# Patient Record
Sex: Female | Born: 1945
Health system: Southern US, Community
[De-identification: ages and names within clinical notes are randomized; demographics above are authoritative.]

## PROBLEM LIST (undated history)

## (undated) DIAGNOSIS — D219 Benign neoplasm of connective and other soft tissue, unspecified: Secondary | ICD-10-CM

## (undated) DIAGNOSIS — K589 Irritable bowel syndrome without diarrhea: Secondary | ICD-10-CM

## (undated) DIAGNOSIS — T7840XA Allergy, unspecified, initial encounter: Secondary | ICD-10-CM

## (undated) DIAGNOSIS — D369 Benign neoplasm, unspecified site: Secondary | ICD-10-CM

## (undated) DIAGNOSIS — K219 Gastro-esophageal reflux disease without esophagitis: Secondary | ICD-10-CM

## (undated) DIAGNOSIS — C801 Malignant (primary) neoplasm, unspecified: Secondary | ICD-10-CM

## (undated) DIAGNOSIS — I10 Essential (primary) hypertension: Secondary | ICD-10-CM

## (undated) DIAGNOSIS — L719 Rosacea, unspecified: Secondary | ICD-10-CM

## (undated) HISTORY — DX: Rosacea, unspecified: L71.9

## (undated) HISTORY — DX: Essential (primary) hypertension: I10

## (undated) HISTORY — PX: BREAST SURGERY: SHX581

## (undated) HISTORY — DX: Benign neoplasm of connective and other soft tissue, unspecified: D21.9

## (undated) HISTORY — DX: Benign neoplasm, unspecified site: D36.9

## (undated) HISTORY — DX: Gastro-esophageal reflux disease without esophagitis: K21.9

## (undated) HISTORY — DX: Allergy, unspecified, initial encounter: T78.40XA

## (undated) HISTORY — DX: Irritable bowel syndrome, unspecified: K58.9

## (undated) HISTORY — PX: OOPHORECTOMY: SHX86

---
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
## 1968-07-08 HISTORY — PX: BREAST EXCISIONAL BIOPSY: SUR124

## 1991-07-09 DIAGNOSIS — D219 Benign neoplasm of connective and other soft tissue, unspecified: Secondary | ICD-10-CM

## 1991-07-09 HISTORY — PX: ABDOMINAL HYSTERECTOMY: SHX81

## 1991-07-09 HISTORY — DX: Benign neoplasm of connective and other soft tissue, unspecified: D21.9

## 2002-07-20 ENCOUNTER — Other Ambulatory Visit: Admission: RE | Admit: 2002-07-20 | Discharge: 2002-07-20 | Payer: Self-pay | Admitting: Family Medicine

## 2004-12-24 ENCOUNTER — Ambulatory Visit: Payer: Self-pay | Admitting: Family Medicine

## 2005-02-18 ENCOUNTER — Ambulatory Visit: Payer: Self-pay | Admitting: Family Medicine

## 2005-05-15 ENCOUNTER — Other Ambulatory Visit: Admission: RE | Admit: 2005-05-15 | Discharge: 2005-05-15 | Payer: Self-pay | Admitting: Family Medicine

## 2005-05-15 ENCOUNTER — Ambulatory Visit: Payer: Self-pay | Admitting: Family Medicine

## 2005-05-15 ENCOUNTER — Encounter: Payer: Self-pay | Admitting: Family Medicine

## 2005-05-16 ENCOUNTER — Encounter: Payer: Self-pay | Admitting: Family Medicine

## 2005-06-18 ENCOUNTER — Ambulatory Visit: Payer: Self-pay | Admitting: Family Medicine

## 2006-03-31 ENCOUNTER — Ambulatory Visit: Payer: Self-pay | Admitting: Family Medicine

## 2006-04-09 ENCOUNTER — Ambulatory Visit: Payer: Self-pay | Admitting: Family Medicine

## 2006-04-30 ENCOUNTER — Ambulatory Visit: Payer: Self-pay | Admitting: Family Medicine

## 2007-05-04 ENCOUNTER — Encounter: Payer: Self-pay | Admitting: Family Medicine

## 2007-05-04 DIAGNOSIS — L719 Rosacea, unspecified: Secondary | ICD-10-CM | POA: Insufficient documentation

## 2007-05-04 DIAGNOSIS — F329 Major depressive disorder, single episode, unspecified: Secondary | ICD-10-CM

## 2007-05-04 DIAGNOSIS — K589 Irritable bowel syndrome without diarrhea: Secondary | ICD-10-CM | POA: Insufficient documentation

## 2007-05-04 DIAGNOSIS — I10 Essential (primary) hypertension: Secondary | ICD-10-CM | POA: Insufficient documentation

## 2007-05-04 DIAGNOSIS — F3289 Other specified depressive episodes: Secondary | ICD-10-CM | POA: Insufficient documentation

## 2007-05-05 ENCOUNTER — Ambulatory Visit: Payer: Self-pay | Admitting: Family Medicine

## 2007-05-06 LAB — CONVERTED CEMR LAB
Basophils Absolute: 0 10*3/uL (ref 0.0–0.1)
CO2: 31 meq/L (ref 19–32)
Calcium: 10 mg/dL (ref 8.4–10.5)
Chloride: 102 meq/L (ref 96–112)
Eosinophils Relative: 3.3 % (ref 0.0–5.0)
GFR calc Af Amer: 94 mL/min
Glucose, Bld: 95 mg/dL (ref 70–99)
HCT: 41.4 % (ref 36.0–46.0)
LDL Cholesterol: 107 mg/dL — ABNORMAL HIGH (ref 0–99)
Neutrophils Relative %: 57.3 % (ref 43.0–77.0)
RBC: 4.48 M/uL (ref 3.87–5.11)
RDW: 12.1 % (ref 11.5–14.6)
Total CHOL/HDL Ratio: 2.7
WBC: 6.1 10*3/uL (ref 4.5–10.5)

## 2007-09-01 ENCOUNTER — Ambulatory Visit: Payer: Self-pay | Admitting: Family Medicine

## 2007-09-01 ENCOUNTER — Encounter: Payer: Self-pay | Admitting: Family Medicine

## 2007-09-03 ENCOUNTER — Encounter (INDEPENDENT_AMBULATORY_CARE_PROVIDER_SITE_OTHER): Payer: Self-pay | Admitting: *Deleted

## 2008-10-31 ENCOUNTER — Ambulatory Visit: Payer: Self-pay | Admitting: Family Medicine

## 2008-11-02 ENCOUNTER — Encounter: Payer: Self-pay | Admitting: Family Medicine

## 2008-11-02 ENCOUNTER — Ambulatory Visit: Payer: Self-pay | Admitting: Family Medicine

## 2008-11-02 LAB — CONVERTED CEMR LAB
Albumin: 3.9 g/dL (ref 3.5–5.2)
BUN: 18 mg/dL (ref 6–23)
Basophils Absolute: 0 10*3/uL (ref 0.0–0.1)
Bilirubin, Direct: 0.1 mg/dL (ref 0.0–0.3)
CO2: 30 meq/L (ref 19–32)
Cholesterol: 190 mg/dL (ref 0–200)
Creatinine, Ser: 0.7 mg/dL (ref 0.4–1.2)
Eosinophils Absolute: 0.3 10*3/uL (ref 0.0–0.7)
Eosinophils Relative: 4.5 % (ref 0.0–5.0)
HDL: 65.8 mg/dL (ref >39.00)
Lymphs Abs: 2.1 10*3/uL (ref 0.7–4.0)
MCHC: 34.8 g/dL (ref 30.0–36.0)
MCV: 92.6 fL (ref 78.0–100.0)
Monocytes Absolute: 0.5 10*3/uL (ref 0.1–1.0)
Neutrophils Relative %: 54.5 % (ref 43.0–77.0)
Platelets: 196 10*3/uL (ref 150.0–400.0)
RDW: 11.7 % (ref 11.5–14.6)
Total Bilirubin: 1.1 mg/dL (ref 0.3–1.2)
Total CHOL/HDL Ratio: 3
Triglycerides: 176 mg/dL — ABNORMAL HIGH (ref 0.0–149.0)
WBC: 6.5 10*3/uL (ref 4.5–10.5)

## 2008-11-07 ENCOUNTER — Encounter (INDEPENDENT_AMBULATORY_CARE_PROVIDER_SITE_OTHER): Payer: Self-pay | Admitting: *Deleted

## 2009-10-30 ENCOUNTER — Telehealth: Payer: Self-pay | Admitting: Family Medicine

## 2009-11-13 ENCOUNTER — Ambulatory Visit: Payer: Self-pay | Admitting: Family Medicine

## 2009-11-13 DIAGNOSIS — J301 Allergic rhinitis due to pollen: Secondary | ICD-10-CM | POA: Insufficient documentation

## 2009-11-14 LAB — CONVERTED CEMR LAB
Albumin: 4.2 g/dL (ref 3.5–5.2)
BUN: 16 mg/dL (ref 6–23)
Basophils Absolute: 0 10*3/uL (ref 0.0–0.1)
Cholesterol: 197 mg/dL (ref 0–200)
Creatinine, Ser: 0.7 mg/dL (ref 0.4–1.2)
Eosinophils Absolute: 0.2 10*3/uL (ref 0.0–0.7)
GFR calc non Af Amer: 86.69 mL/min (ref >60)
HCT: 37.9 % (ref 36.0–46.0)
HDL: 84.4 mg/dL (ref >39.00)
Hemoglobin: 13 g/dL (ref 12.0–15.0)
LDL Cholesterol: 102 mg/dL — ABNORMAL HIGH (ref 0–99)
Lymphocytes Relative: 31.9 % (ref 12.0–46.0)
Lymphs Abs: 1.6 10*3/uL (ref 0.7–4.0)
MCHC: 34.3 g/dL (ref 30.0–36.0)
Monocytes Relative: 10.2 % (ref 3.0–12.0)
Neutro Abs: 2.7 10*3/uL (ref 1.4–7.7)
Phosphorus: 4 mg/dL (ref 2.3–4.6)
Platelets: 208 10*3/uL (ref 150.0–400.0)
RDW: 13.2 % (ref 11.5–14.6)
TSH: 1.6 microintl units/mL (ref 0.35–5.50)
Total CHOL/HDL Ratio: 2
Triglycerides: 53 mg/dL (ref 0.0–149.0)

## 2009-11-15 ENCOUNTER — Encounter: Payer: Self-pay | Admitting: Family Medicine

## 2009-11-15 ENCOUNTER — Ambulatory Visit: Payer: Self-pay | Admitting: Family Medicine

## 2009-11-15 LAB — HM MAMMOGRAPHY: HM Mammogram: NORMAL

## 2009-11-17 ENCOUNTER — Encounter: Payer: Self-pay | Admitting: Family Medicine

## 2010-08-07 NOTE — Progress Notes (Signed)
Summary: Hyoscyamine 0.125mg  refill  Phone Note Refill Request Call back at [phone] Message from:  [facility] on October 30, 2009 9:37 AM  Refills Requested: Medication #1:  LEVSIN/SL 0.125 MG  SUBL 1 SL two times a day as needed abdominal cramping. [facility] request refill electronically for Hyoscyamine 0.125mg  No date sent for last refill. Pt last seen 10/31/2008.Please advise.    Method Requested: Telephone to Pharmacy Initial call taken by: Lewanda Rife LPN,  October 30, 2009 9:38 AM  Follow-up for Phone Call        px written on EMR for call in  Follow-up by: Judith Part MD,  October 30, 2009 12:23 PM  Additional Follow-up for Phone Call Additional follow up Details #1::        Medication [facility] as instructed. Lewanda Rife LPN  October 30, 2009 12:51 PM     New/Updated Medications: LEVSIN/SL 0.125 MG  SUBL (HYOSCYAMINE SULFATE) 1 SL two times a day as needed abdominal cramping Prescriptions: LEVSIN/SL 0.125 MG  SUBL (HYOSCYAMINE SULFATE) 1 SL two times a day as needed abdominal cramping  #15 x 5   Entered and Authorized by:   Judith Part MD   Signed by:   Lewanda Rife LPN on 78/29/5621   Method used:   Telephoned to ...       [facility] (retail)       [address]       [place], [place]  [address]       Ph: [phone] or [phone]       Fax: [phone]   RxID:   [id number]

## 2010-08-07 NOTE — Letter (Signed)
Summary: Results Follow up Letter  Marie Kennedy at [facility]  [address] [place], [place] [address]   Phone: [phone]  Fax: [phone]    11/17/2009 MRN: [id number]    [facility] [address] [place], [place]  [address]    Dear Marie Kennedy,  The following are the results of your recent test(s):  Test         Result    Pap Smear:        Normal _____  Not Normal _____ Comments: ______________________________________________________ Cholesterol: LDL(Bad cholesterol):         Your goal is less than:         HDL (Good cholesterol):       Your goal is more than: Comments:  ______________________________________________________ Mammogram:        Normal __X___  Not Normal _____ Comments:Please repeat mammogram in one year.  ___________________________________________________________________ Hemoccult:        Normal _____  Not normal _______ Comments:    _____________________________________________________________________ Other Tests:    We routinely do not discuss normal results over the telephone.  If you desire a copy of the results, or you have any questions about this information we can discuss them at your next office visit.   Sincerely,    Idamae Schuller Khaleb Broz,MD  MT/ri

## 2010-08-07 NOTE — Assessment & Plan Note (Signed)
Summary: REFILLL MEDICATIONS/CLE   Vital Signs:  Patient profile:   65 year old female Height:      64 inches Weight:      132.75 pounds BMI:     22.87 Temp:     98.3 degrees F oral Pulse rate:   76 / minute Pulse rhythm:   regular BP sitting:   126 / 74  (left arm) Cuff size:   regular  Vitals Entered By: Lewanda Rife LPN (Nov 14, 8411 9:18 AM) CC: refill med   History of Present Illness: here for f/u of HTN and IBS  is doing well  is looking foward to next vacation   wt is down 7 lb this visit  is eating less in general  is feeling ok in general   had a bad cold in march -- took a month to get over it with cough - with a sore chest   last LDL 89- good   bp continues to be well controlled with 126/74 today   mood -- is ok in general   may retire in another year   worse allergies this spring- both runny and stuffy nose and sneezing  Allergies (verified): No Known Drug Allergies  Past History:  Past Medical History: Last updated: 10/31/2008 Depression Hypertension IBS  rosacea  Past Surgical History: Last updated: May 18, 2007 Fibroids, cysts endometriosis (1993) Breast lumpectomy- no cancer (1970's) Hysterectomy Colonoscopy- neg (2002) dexa- normal per pt (03/2002)  Family History: Last updated: 18-May-2007 Father: CAD, HTN, DM 2, died from brain tumor Mother: HTN, died from breast cancer Siblings: 2 brothers  Social History: Last updated: 2007-05-18 Marital Status: Married Children: none Occupation: accountant  Risk Factors: Smoking Status: never (05/18/07)  Review of Systems General:  Denies fatigue, fever, loss of appetite, and malaise. Eyes:  Denies discharge and eye irritation. ENT:  Complains of nasal congestion and postnasal drainage; denies sinus pressure and sore throat. CV:  Denies chest pain or discomfort and palpitations. Resp:  Denies cough, shortness of breath, and wheezing. GI:  Denies diarrhea, nausea, and vomiting. MS:   Denies muscle aches. Derm:  Denies itching, poor wound healing, and rash. Neuro:  Denies headaches, numbness, and tingling. Psych:  Denies anxiety and depression. Endo:  Denies cold intolerance, excessive thirst, excessive urination, and heat intolerance. Heme:  Denies abnormal bruising and bleeding. Allergy:  Complains of seasonal allergies and sneezing.  Physical Exam  General:  Well-developed,well-nourished,in no acute distress; alert,appropriate and cooperative throughout examination Head:  normocephalic, atraumatic, and no abnormalities observed.  no sinus tenderness Eyes:  vision grossly intact, pupils equal, pupils round, and pupils reactive to no conjunctival pallor, injection or icterus  Nose:  nares boggy and pale Mouth:  pharynx pink and moist.   Neck:  No deformities, masses, or tenderness noted.supple, full ROM, no masses, no thyromegaly, no JVD, and no carotid bruits.   Lungs:  Normal respiratory effort, chest expands symmetrically. Lungs are clear to auscultation, no crackles or wheezes. Heart:  Normal rate and regular rhythm. S1 and S2 normal without gallop, murmur, click, rub or other extra sounds. Abdomen:  Bowel sounds positive,abdomen soft and non-tender without masses, organomegaly or hernias noted. no renal bruits Pulses:  R and L carotid,radial,femoral,dorsalis pedis and posterior tibial pulses are full and equal bilaterally Extremities:  No clubbing, cyanosis, edema, or deformity noted with normal full range of motion of all joints.   Neurologic:  sensation intact to light touch, gait normal, and DTRs symmetrical and normal.   Skin:  Intact without suspicious lesions or rashes Cervical Nodes:  No lymphadenopathy noted Inguinal Nodes:  No significant adenopathy Psych:  normal affect, talkative and pleasant    Impression & Recommendations:  Problem # 1:  HYPERTENSION (ICD-401.9) Assessment Unchanged  good control with current med enc more exercise and healthy  diet  lab today and adv refilled med  Her updated medication list for this problem includes:    Lisinopril-hydrochlorothiazide 10-12.5 Mg Tabs (Lisinopril-hydrochlorothiazide) ..... One half by mouth once daily  Orders: Venipuncture (66440) TLB-Lipid Panel (80061-LIPID) TLB-Renal Function Panel (80069-RENAL) TLB-CBC Platelet - w/Differential (85025-CBCD) TLB-TSH (Thyroid Stimulating Hormone) (84443-TSH) TLB-ALT (SGPT) (84460-ALT) TLB-AST (SGOT) (84450-SGOT) Prescription Created Electronically ([phone])  BP today: 126/74 Prior BP: 130/70 (10/31/2008)  Labs Reviewed: K+: 4.0 (10/31/2008) Creat: : 0.7 (10/31/2008)   Chol: 190 (10/31/2008)   HDL: 65.80 (10/31/2008)   LDL: 89 (10/31/2008)   TG: 176.0 (10/31/2008)  Problem # 2:  OTHER SCREENING MAMMOGRAM (ICD-V76.12) Assessment: Comment Only annual mam sched at pt request Orders: Radiology Referral (Radiology)  Problem # 3:  ROSACEA (ICD-695.3) Assessment: Improved doing well with metrocream from her dermatologist currently   Problem # 4:  Hx of IBS (ICD-564.1) Assessment: Unchanged  refilled levsin which she uses occas  overall stressed imp of high fiber diet -- and update if worse or not imp  trial of fiber suppl enc  Orders: Prescription Created Electronically [phone])  Problem # 5:  ALLERGIC RHINITIS, SEASONAL (ICD-477.0) Assessment: New recommend zyrtec or claritin otc and update if not imp could consid adding nasal steroid if not imp  Complete Medication List: 1)  Lisinopril-hydrochlorothiazide 10-12.5 Mg Tabs (Lisinopril-hydrochlorothiazide) .... One half by mouth once daily 2)  Metrocream 0.75 % Crea (Metronidazole) .... Apply to affected area once daily 3)  Levsin/sl 0.125 Mg Subl (Hyoscyamine sulfate) .Marland Kitchen.. 1 sl two times a day as needed abdominal cramping  Patient Instructions: 1)  for allergy symptoms - I recommend zyrtec or claritin 10 mg once daily   2)  we will schedule mammogram at check out  3)  lab  today  4)  follow up in one year unless problems  5)  keep working on healthy diet and exercise  6)  I sent medicines to pharmacy  Prescriptions: LEVSIN/SL 0.125 MG  SUBL (HYOSCYAMINE SULFATE) 1 SL two times a day as needed abdominal cramping  #15 x 11   Entered and Authorized by:   Judith Part MD   Signed by:   Judith Part MD on 11/13/2009   Method used:   Electronically to        [facility] (retail)       [address]       [place], [place]  [address]       Ph: [phone] or [phone]       Fax: [phone]   RxID:   [id number] LISINOPRIL-HYDROCHLOROTHIAZIDE 10-12.5 MG  TABS (LISINOPRIL-HYDROCHLOROTHIAZIDE) one half by mouth once daily  #30 x 11   Entered and Authorized by:   Judith Part MD   Signed by:   Judith Part MD on 11/13/2009   Method used:   Electronically to        [facility] (retail)       [address]       [place], [place]  [address]       Ph: [phone] or [phone]       Fax: [phone]   RxID:   [id number]   Current  Allergies (reviewed today): No known allergies

## 2010-08-07 NOTE — Miscellaneous (Signed)
Summary: mammogram screenig  Clinical Lists Changes  Observations: Added new observation of MAMMO DUE: 11/2010 (11/17/2009 16:33) Added new observation of MAMMOGRAM: normal (11/15/2009 16:33)      Preventive Care Screening  Mammogram:    Date:  11/15/2009    Next Due:  11/2010    Results:  normal

## 2010-12-04 ENCOUNTER — Telehealth: Payer: Self-pay | Admitting: *Deleted

## 2010-12-04 DIAGNOSIS — Z1231 Encounter for screening mammogram for malignant neoplasm of breast: Secondary | ICD-10-CM | POA: Insufficient documentation

## 2010-12-04 NOTE — Telephone Encounter (Signed)
Patient is asking for order for her mammogram to be faxed to [facility].

## 2010-12-04 NOTE — Telephone Encounter (Signed)
Will do mammogram order and route to [facility]

## 2010-12-07 NOTE — Telephone Encounter (Signed)
MMG order was faxed to [facility] per Aram Beecham.

## 2010-12-08 ENCOUNTER — Encounter: Payer: Self-pay | Admitting: Family Medicine

## 2010-12-12 ENCOUNTER — Encounter: Payer: Self-pay | Admitting: Family Medicine

## 2010-12-12 ENCOUNTER — Ambulatory Visit (INDEPENDENT_AMBULATORY_CARE_PROVIDER_SITE_OTHER): Payer: Managed Care, Other (non HMO) | Admitting: Family Medicine

## 2010-12-12 DIAGNOSIS — K589 Irritable bowel syndrome without diarrhea: Secondary | ICD-10-CM

## 2010-12-12 DIAGNOSIS — M8580 Other specified disorders of bone density and structure, unspecified site: Secondary | ICD-10-CM

## 2010-12-12 DIAGNOSIS — Z23 Encounter for immunization: Secondary | ICD-10-CM

## 2010-12-12 DIAGNOSIS — M949 Disorder of cartilage, unspecified: Secondary | ICD-10-CM

## 2010-12-12 DIAGNOSIS — M899 Disorder of bone, unspecified: Secondary | ICD-10-CM

## 2010-12-12 DIAGNOSIS — I10 Essential (primary) hypertension: Secondary | ICD-10-CM

## 2010-12-12 DIAGNOSIS — Z780 Asymptomatic menopausal state: Secondary | ICD-10-CM | POA: Insufficient documentation

## 2010-12-12 DIAGNOSIS — M810 Age-related osteoporosis without current pathological fracture: Secondary | ICD-10-CM | POA: Insufficient documentation

## 2010-12-12 MED ORDER — HYOSCYAMINE SULFATE 0.125 MG SL SUBL: 0.1250 mg | SUBLINGUAL_TABLET | Freq: Two times a day (BID) | SUBLINGUAL | Status: DC | PRN

## 2010-12-12 MED ORDER — LISINOPRIL-HYDROCHLOROTHIAZIDE 10-12.5 MG PO TABS: 0.5000 | ORAL_TABLET | Freq: Every day | ORAL | Status: DC

## 2010-12-12 NOTE — Assessment & Plan Note (Signed)
This is stable Refilled levsin Watches diet  Due for 10 y colonosc screen whenever she wants to schedule it - she voiced awareness

## 2010-12-12 NOTE — Assessment & Plan Note (Signed)
bp is well controlled on lisinopril hct without change Wt is stable Disc exercise and healthy diet  Lab today Med refilled

## 2010-12-12 NOTE — Patient Instructions (Addendum)
Try to get 1200-1500 mg of calcium per day with at least 1000 iu of vitamin D - for bone health  Also any form of weight bearing exercise like walking helps your bones  We will schedule bone density test at check out  Blood pressure looks good  Labs today  If you are interested in shingles vaccine in future - call your insurance company to see how coverage is and call us to schedule  Pneumovax today  I sent your px to the pharmacy

## 2010-12-12 NOTE — Progress Notes (Signed)
Subjective:    Patient ID: Marie Kennedy, female    DOB: [birth year], 65 y.o.   MRN: [id number]  HPI Here for f/u of chronic med problems incl HTN and rosacea and IBS  Feeling good and no new medical problems  Another year before retirement   HTN is well controlled  bp 116/68 today On lisinopril hct No side eff No ha or cp or palpitations  Not enough exercise- likes to walk    Uses metrocream for rosacea- does not need refil   Last colonosc 2002 Has ibs- uses levsin for cramping  No changes  Due to screen colonosc- does not want to plan that yet  No blood in stool or other problems  Cramping is no worse   Diet is good - very healthy - tries to be   Had abnormal bone density screen - this spring at work  Needs a dexa  Not taking ca and vit D  No hx of fractures No special risk fx for OP   Interested in pneumovax and shingles vaccine  We disc these  Patient Active Problem List  Diagnoses  . DEPRESSION  . HYPERTENSION  . ALLERGIC RHINITIS, SEASONAL  . IBS  . ROSACEA  . Other screening mammogram  . Osteopenia  . Post-menopausal   Past Medical History  Diagnosis Date  . Depression   . Hypertension   . Rosacea   . IBS (irritable bowel syndrome)   . Fibroids 1993    cysts endometriosis   Past Surgical History  Procedure Date  . Breast surgery 1970's    lumpectomy no cancer  . Abdominal hysterectomy    History  Substance Use Topics  . Smoking status: Never Smoker   . Smokeless tobacco: Not on file  . Alcohol Use: Not on file   Family History  Problem Relation Age of Onset  . Hypertension Mother   . Heart disease Father     CAD  . Hypertension Father   . Diabetes Father     TYPE II   No Known Allergies Current Outpatient Prescriptions on File Prior to Visit  Medication Sig Dispense Refill  . metroNIDAZOLE (METROCREAM) 0.75 % cream Apply topically daily. To affected area.            Review of Systems ROSReview of Systems  Constitutional:  Negative for fever, appetite change, fatigue and unexpected weight change.  Eyes: Negative for pain and visual disturbance.  Respiratory: Negative for cough and shortness of breath.   Cardiovascular: Negative for cp or sob or palp.   Gastrointestinal: Negative for nausea, diarrhea and constipation.  Genitourinary: Negative for urgency and frequency.  Skin: Negative for pallor.or rash, rosacea is in good control   Neurological: Negative for weakness, light-headedness, numbness and headaches.  Hematological: Negative for adenopathy. Does not bruise/bleed easily.  Psychiatric/Behavioral: Negative for dysphoric mood. The patient is not nervous/anxious.          Objective:   Physical Exam  Constitutional: She appears well-developed and well-nourished. No distress.  HENT:  Head: Normocephalic and atraumatic.  Right Ear: External ear normal.  Left Ear: External ear normal.  Nose: Nose normal.  Mouth/Throat: Oropharynx is clear and moist.  Eyes: Conjunctivae and EOM are normal. Pupils are equal, round, and reactive to light.  Neck: Normal range of motion. Neck supple. No JVD present. Carotid bruit is not present. Erythema present. No thyromegaly present.  Cardiovascular: Normal rate, regular rhythm, normal heart sounds and intact distal pulses.   Pulmonary/Chest: Effort normal  and breath sounds normal. No respiratory distress. She has no wheezes.  Abdominal: Soft. Bowel sounds are normal. She exhibits no distension, no abdominal bruit and no mass. There is no tenderness.  Musculoskeletal: Normal range of motion. She exhibits no edema and no tenderness.  Lymphadenopathy:    She has no cervical adenopathy.  Neurological: She is alert. She has normal reflexes. Coordination normal.  Skin: Skin is warm and dry. No rash noted. No erythema. No pallor.  Psychiatric: She has a normal mood and affect.          Assessment & Plan:

## 2010-12-12 NOTE — Progress Notes (Signed)
Pt received pneumovax IM in left deltoid.

## 2010-12-12 NOTE — Assessment & Plan Note (Signed)
New- from heel scan Rev need for ca and d and exercise Check D level sched dexa  Disc safety No hx of fx bones in past

## 2010-12-13 LAB — CBC WITH DIFFERENTIAL/PLATELET
Basophils Absolute: 0 10*3/uL (ref 0.0–0.1)
HCT: 37.4 % (ref 36.0–46.0)
Lymphs Abs: 2.2 10*3/uL (ref 0.7–4.0)
MCV: 91.9 fl (ref 78.0–100.0)
Monocytes Absolute: 0.6 10*3/uL (ref 0.1–1.0)
Platelets: 212 10*3/uL (ref 150.0–400.0)
RDW: 13.2 % (ref 11.5–14.6)

## 2010-12-13 LAB — TSH: TSH: 2.11 u[IU]/mL (ref 0.35–5.50)

## 2010-12-13 LAB — COMPREHENSIVE METABOLIC PANEL
ALT: 33 U/L (ref 0–35)
Alkaline Phosphatase: 58 U/L (ref 39–117)
Creatinine, Ser: 0.9 mg/dL (ref 0.4–1.2)
Glucose, Bld: 138 mg/dL — ABNORMAL HIGH (ref 70–99)
Sodium: 145 mEq/L (ref 135–145)
Total Bilirubin: 1 mg/dL (ref 0.3–1.2)
Total Protein: 7.4 g/dL (ref 6.0–8.3)

## 2010-12-13 LAB — VITAMIN D 25 HYDROXY (VIT D DEFICIENCY, FRACTURES): Vit D, 25-Hydroxy: 43 ng/mL (ref 30–89)

## 2010-12-18 ENCOUNTER — Telehealth: Payer: Self-pay

## 2010-12-18 NOTE — Telephone Encounter (Signed)
Message copied by Patience Musca on Tue Dec 18, 2010  6:28 PM ------      Message from: Roxy Manns A      Created: Thu Dec 13, 2010  8:09 PM       Sugar is up a bit but specimen was not fasting       In next 1-2 mo sched fasting lab for glucose and a1c for hyperglycemia please      Other labs are ok       Keep up good health habits

## 2010-12-18 NOTE — Telephone Encounter (Signed)
Patient notified as instructed by telephone. Pt will call back to schedule fasting lab

## 2011-02-13 ENCOUNTER — Ambulatory Visit: Payer: Self-pay | Admitting: Family Medicine

## 2011-02-13 LAB — HM DEXA SCAN

## 2011-02-15 ENCOUNTER — Encounter: Payer: Self-pay | Admitting: Family Medicine

## 2011-02-21 ENCOUNTER — Encounter: Payer: Self-pay | Admitting: *Deleted

## 2011-02-22 ENCOUNTER — Encounter: Payer: Self-pay | Admitting: Family Medicine

## 2011-03-29 ENCOUNTER — Ambulatory Visit (INDEPENDENT_AMBULATORY_CARE_PROVIDER_SITE_OTHER): Payer: Managed Care, Other (non HMO) | Admitting: Family Medicine

## 2011-03-29 ENCOUNTER — Encounter: Payer: Self-pay | Admitting: Family Medicine

## 2011-03-29 ENCOUNTER — Telehealth: Payer: Self-pay | Admitting: Family Medicine

## 2011-03-29 ENCOUNTER — Ambulatory Visit: Payer: Self-pay | Admitting: Family Medicine

## 2011-03-29 VITALS — BP 120/72 | HR 72 | Temp 98.2°F | Ht 64.0 in | Wt 141.0 lb

## 2011-03-29 DIAGNOSIS — R1011 Right upper quadrant pain: Secondary | ICD-10-CM

## 2011-03-29 DIAGNOSIS — K8020 Calculus of gallbladder without cholecystitis without obstruction: Secondary | ICD-10-CM

## 2011-03-29 NOTE — Assessment & Plan Note (Addendum)
Episode of RUQ pain earlier this week that has resolved  Marie Kennedy and location is consistent with gallbladder pain  Adv no fat diet for now Send for abd [place]  If pos will ref to surgeon , if neg investigate further  ADDENDUM:- US showed gallstones and pt was ref to surgeon

## 2011-03-29 NOTE — Telephone Encounter (Signed)
Patient notified as instructed by telephone. Pt will wait to hear from pt care coordinator and pt can be reached at [phone] Work # next week. Pt said she feels fine now.

## 2011-03-29 NOTE — Patient Instructions (Signed)
We will schedule you for an abdominal ultrasound at check out  Eat a fat free diet until we update you  If pain re occurs severely after hours - go to ER

## 2011-03-29 NOTE — Telephone Encounter (Signed)
Call report from radiology- gallbladder is full of stones - do not have report back yet I need to refer her to general surgeon  Will go ahead and do that - should have report soon  Eat low fat If severe abd pain- go to ER Pt can be reached at [phone]

## 2011-03-29 NOTE — Progress Notes (Signed)
Subjective:    Patient ID: Marie Kennedy, female    DOB: Oct 01, 1945, 65 y.o.   MRN: [id number]  HPI Is having abd pain on R upper side - wed in middle nt until next am  Ok since  Pain was fairly sharp -- fairly intense  Nothing made it better or worse -- walking or sitting  Did take some antacid and hycosamine- did not help  Was nauseated but did not throw up   Pain did not go to back or shoulder No diarrhea or constipation - (other than her normal )   Ate broccoli with cheese for dinner that night and chicken salad for lunch   Patient Active Problem List  Diagnoses  . DEPRESSION  . HYPERTENSION  . ALLERGIC RHINITIS, SEASONAL  . IBS  . ROSACEA  . Other screening mammogram  . Osteopenia  . Post-menopausal  . Abdominal pain, right upper quadrant  . Gallstones   Past Medical History  Diagnosis Date  . Depression   . Hypertension   . Rosacea   . IBS (irritable bowel syndrome)   . Fibroids 1993    cysts endometriosis   Past Surgical History  Procedure Date  . Breast surgery 1970's    lumpectomy no cancer  . Abdominal hysterectomy    History  Substance Use Topics  . Smoking status: Never Smoker   . Smokeless tobacco: Not on file  . Alcohol Use: Not on file   Family History  Problem Relation Age of Onset  . Hypertension Mother   . Heart disease Father     CAD  . Hypertension Father   . Diabetes Father     TYPE II   No Known Allergies Current Outpatient Prescriptions on File Prior to Visit  Medication Sig Dispense Refill  . hyoscyamine (LEVSIN SL) 0.125 MG SL tablet Place 1 tablet (0.125 mg total) under the tongue 2 (two) times daily as needed for cramping. Abdominal cramping  30 tablet  11  . lisinopril-hydrochlorothiazide (PRINZIDE,ZESTORETIC) 10-12.5 MG per tablet Take 0.5 tablets by mouth daily.  90 tablet  3  . metroNIDAZOLE (METROCREAM) 0.75 % cream Apply topically daily. To affected area.       . Multiple Vitamin (MULTIVITAMIN) tablet Take 1 tablet by  mouth daily.        . DiphenhydrAMINE HCl (ALLERGY MED PO) Take 1 tablet by mouth daily as needed.             Review of Systems Review of Systems  Constitutional: Negative for fever, appetite change, fatigue and unexpected weight change.  Eyes: Negative for pain and visual disturbance.  Respiratory: Negative for cough and shortness of breath.   Cardiovascular: Negative for cp or palpitations    Gastrointestinal: Negative for, diarrhea and constipation. or blood in the stool Genitourinary: Negative for urgency and frequency.  Skin: Negative for pallor or rash   Neurological: Negative for weakness, light-headedness, numbness and headaches.  Hematological: Negative for adenopathy. Does not bruise/bleed easily.  Psychiatric/Behavioral: Negative for dysphoric mood. The patient is not nervous/anxious.          Objective:   Physical Exam  Constitutional: She appears well-developed and well-nourished. No distress.  HENT:  Head: Normocephalic and atraumatic.  Mouth/Throat: Oropharynx is clear and moist.  Eyes: Conjunctivae and EOM are normal. Pupils are equal, round, and reactive to light. No scleral icterus.  Neck: Normal range of motion. Neck supple. No JVD present. Carotid bruit is not present. No erythema present. No thyromegaly present.  Cardiovascular:  Normal rate, regular rhythm, normal heart sounds and intact distal pulses.   Pulmonary/Chest: Effort normal and breath sounds normal. No respiratory distress. She has no wheezes.  Abdominal: Soft. Bowel sounds are normal. She exhibits no distension and no mass. There is tenderness in the right upper quadrant. There is no rigidity, no rebound, no guarding and negative Murphy's sign.       Mild RUQ tenderness under ribs  No murphy sign  No rebound/ guarding No rib or cva tenderness  Musculoskeletal: She exhibits no edema and no tenderness.  Lymphadenopathy:    She has no cervical adenopathy.  Neurological: She is alert. She has  normal reflexes. Coordination normal.  Skin: Skin is warm and dry. No rash noted. No erythema. No pallor.  Psychiatric: She has a normal mood and affect.          Assessment & Plan:

## 2011-04-01 ENCOUNTER — Encounter: Payer: Self-pay | Admitting: Family Medicine

## 2011-04-10 ENCOUNTER — Ambulatory Visit (INDEPENDENT_AMBULATORY_CARE_PROVIDER_SITE_OTHER): Payer: Managed Care, Other (non HMO) | Admitting: Surgery

## 2011-04-17 ENCOUNTER — Encounter (INDEPENDENT_AMBULATORY_CARE_PROVIDER_SITE_OTHER): Payer: Self-pay | Admitting: Surgery

## 2011-04-17 ENCOUNTER — Ambulatory Visit (INDEPENDENT_AMBULATORY_CARE_PROVIDER_SITE_OTHER): Payer: Managed Care, Other (non HMO) | Admitting: Surgery

## 2011-04-17 VITALS — BP 132/88 | HR 68 | Temp 98.8°F | Resp 16 | Ht 64.0 in | Wt 140.2 lb

## 2011-04-17 DIAGNOSIS — K8000 Calculus of gallbladder with acute cholecystitis without obstruction: Secondary | ICD-10-CM | POA: Insufficient documentation

## 2011-04-17 NOTE — Progress Notes (Signed)
Chief Complaint  Patient presents with  . Other    new pt- eval gb with stones    HPI Marie Kennedy is a 65 y.o. female. HPI The patient presents today due to epigastric abdominal pain. About 2 weeks ago, she had a episode of severe epigastric pain which radiated to her right upper quadrant that lasted about 6 hours. This was after dinner. She had a fatty meal prior to this. The pain was severe in nature and short period it was associated with nausea but no vomiting. She has had no change in bowel or bladder function. She is sent today at the request of Dr. Milinda Antis.  Past Medical History  Diagnosis Date  . Depression   . Hypertension   . Rosacea   . IBS (irritable bowel syndrome)   . Fibroids 1993    cysts endometriosis  . GERD (gastroesophageal reflux disease)     Past Surgical History  Procedure Date  . Breast surgery 1970's    lumpectomy no cancer  . Abdominal hysterectomy 1993    Family History  Problem Relation Age of Onset  . Hypertension Mother   . Cancer Mother     breast, uterine, and cervical  . Heart disease Father     CAD  . Hypertension Father   . Diabetes Father     TYPE II  . Cancer Father     brain    Social History History  Substance Use Topics  . Smoking status: Never Smoker   . Smokeless tobacco: Not on file  . Alcohol Use: No    No Known Allergies  Current Outpatient Prescriptions  Medication Sig Dispense Refill  . Calcium Carbonate-Vit D-Min 600-400 MG-UNIT TABS Take 1 tablet by mouth daily.        . DiphenhydrAMINE HCl (ALLERGY MED PO) Take 1 tablet by mouth daily as needed.        . hyoscyamine (LEVSIN SL) 0.125 MG SL tablet Place 1 tablet (0.125 mg total) under the tongue 2 (two) times daily as needed for cramping. Abdominal cramping  30 tablet  11  . lisinopril-hydrochlorothiazide (PRINZIDE,ZESTORETIC) 10-12.5 MG per tablet Take 0.5 tablets by mouth daily.  90 tablet  3  . metroNIDAZOLE (METROCREAM) 0.75 % cream Apply topically daily. To  affected area.       . Multiple Vitamin (MULTIVITAMIN) tablet Take 1 tablet by mouth daily.          Review of Systems Review of Systems  Constitutional: Negative for fever, activity change and appetite change.  HENT: Negative.   Eyes: Negative.   Respiratory: Negative for cough and shortness of breath.   Cardiovascular: Negative for chest pain and palpitations.  Gastrointestinal: Positive for nausea and abdominal pain. Negative for vomiting.  Genitourinary: Negative.   Musculoskeletal: Negative.   Skin: Negative.   Neurological: Negative.   Hematological: Negative.   Psychiatric/Behavioral: Negative.     Blood pressure 132/88, pulse 68, temperature 98.8 F (37.1 C), temperature source Temporal, resp. rate 16, height 5\' 4"  (1.626 m), weight 140 lb 3.2 oz (63.594 kg).  Physical Exam Physical Exam  Constitutional: She is oriented to person, place, and time. She appears well-developed and well-nourished.  HENT:  Head: Normocephalic and atraumatic.  Nose: Nose normal.  Eyes: EOM are normal. Pupils are equal, round, and reactive to light.  Neck: Normal range of motion. Neck supple.  Cardiovascular: Normal rate and regular rhythm.   No murmur heard. Pulmonary/Chest: Effort normal and breath sounds normal.  Abdominal: Soft.  Bowel sounds are normal. There is no tenderness.  Musculoskeletal: Normal range of motion.  Neurological: She is alert and oriented to person, place, and time.  Skin: Skin is warm and dry.  Psychiatric: She has a normal mood and affect. Her behavior is normal. Judgment and thought content normal.    Data Reviewed Dr Milinda Antis notes. U/s  Gallstones without gallbladder wall thickening.  CBD 4.4  MM  Assessment    Biliary Colic    Plan   Laparoscopic cholecystectomy with cholangiogram. The procedure has been discussed with the patient. Operative and non operative treatments have been discussed. Risks of surgery include bleeding, iInfection,  Common bile duct  injury,  Injury to the stomach,liver, colon,small intestine, abdominal wall,  Diaphragm,  Major blood vessels,  And the need for an open procedure.  Other risks include worsening of medical problems, death,  DVT and pulmonary embolism, and cardiovascular events.   Medical options have also been discussed.  The likely hood of achieving the patient goal of  symptom resolution is moderate. The patient has been informed of long term expectations of surgery and non surgical options,  The patient agrees to proceed.         Gabrian Hoque A. 04/17/2011, 2:33 PM

## 2011-04-17 NOTE — Patient Instructions (Signed)
Cholelithiasis, Gallbladder Disease (Gallstones) Gallstones are a form of gallbladder disease. When there is an infection of the gallbladder it is called cholecystitis. It is usually caused by a build-up of stones (gallstones or cholelithiasis) in your gallbladder. The gallbladder is not an essential organ. This means it is not necessary for life. It is located slightly to the right of center in the belly (abdomen), behind the liver. It stores bile made in the liver. Bile aids in digestion and absorption of fats. Gallbladder disease may result in feeling sick to your stomach (nausea), abdominal pain, and jaundice. In severe cases, emergency surgery may be required. Gallstones are the most common type of gallbladder disease. They begin as small crystals and slowly grow into stones. Gallstone pain occurs when the gallbladder spasms and a gallstone is blocking the duct. Pain can also occur when a stone passes out of the duct. The pain usually begins suddenly. It may persist from several minutes to several hours. Infection can occur. Infection can add to discomfort and severity of an acute attack. The pain may be made worse by breathing deeply or by being jarred. There may be fever and tenderness to the touch. In some cases, when gallstones do not move into the bile duct, people have no pain or symptoms. These are called "silent" gallstones. Women are three times more likely to develop gallstones than men. Women who have had several pregnancies are more likely to have gallbladder disease. Physicians sometimes advise removing diseased gallbladders before future pregnancies. Other factors that increase the risk of gallbladder disease are obesity, diets heavy in fried foods and dairy products, increasing age, prolonged use of medications containing female hormones, and heredity. HOME CARE INSTRUCTIONS  Only take over-the-counter or prescription medicines for pain, discomfort, or fever as directed by your caregiver.     Follow a low fat diet until seen again. (Fat causes the gallbladder to contract.)   Follow-up as instructed. Attacks are almost always recurrent and surgery is usually required for permanent treatment.  SEEK IMMEDIATE MEDICAL CARE IF:  Pain is increasing and is not controlled by medications.   You have an oral temperature above 101, not controlled by medication.   You develop nausea and vomiting.  MAKE SURE YOU:   Understand these instructions.   Will watch your condition.   Will get help right away if you are not doing well or get worse.  Document Released: 06/20/2005 Document Re-Released: 06/06/2008 [employer] Patient Information 2011 [employer]. 

## 2011-05-17 ENCOUNTER — Other Ambulatory Visit (INDEPENDENT_AMBULATORY_CARE_PROVIDER_SITE_OTHER): Payer: Self-pay | Admitting: Surgery

## 2011-05-22 ENCOUNTER — Other Ambulatory Visit (INDEPENDENT_AMBULATORY_CARE_PROVIDER_SITE_OTHER): Payer: Self-pay | Admitting: Surgery

## 2011-06-13 ENCOUNTER — Encounter (HOSPITAL_COMMUNITY): Payer: Self-pay | Admitting: Pharmacy Technician

## 2011-06-14 ENCOUNTER — Encounter (HOSPITAL_COMMUNITY)
Admission: RE | Admit: 2011-06-14 | Discharge: 2011-06-14 | Disposition: A | Discharge status: Home / Self Care | Payer: Managed Care, Other (non HMO) | Source: Ambulatory Visit | Attending: Surgery | Admitting: Surgery

## 2011-06-14 ENCOUNTER — Ambulatory Visit (HOSPITAL_COMMUNITY)
Admission: RE | Admit: 2011-06-14 | Discharge: 2011-06-14 | Disposition: A | Discharge status: Home / Self Care | Payer: Managed Care, Other (non HMO) | Source: Ambulatory Visit | Attending: Surgery | Admitting: Surgery

## 2011-06-14 ENCOUNTER — Encounter (HOSPITAL_COMMUNITY): Payer: Self-pay

## 2011-06-14 ENCOUNTER — Other Ambulatory Visit: Payer: Self-pay

## 2011-06-14 DIAGNOSIS — Z01810 Encounter for preprocedural cardiovascular examination: Secondary | ICD-10-CM | POA: Insufficient documentation

## 2011-06-14 DIAGNOSIS — Z01812 Encounter for preprocedural laboratory examination: Secondary | ICD-10-CM | POA: Insufficient documentation

## 2011-06-14 DIAGNOSIS — Z01818 Encounter for other preprocedural examination: Secondary | ICD-10-CM | POA: Insufficient documentation

## 2011-06-14 LAB — CBC
HCT: 41.7 % (ref 36.0–46.0)
Hemoglobin: 13.8 g/dL (ref 12.0–15.0)
MCH: 29.7 pg (ref 26.0–34.0)
MCHC: 33.1 g/dL (ref 30.0–36.0)
MCV: 89.9 fL (ref 78.0–100.0)
Platelets: 236 K/uL (ref 150–400)
RBC: 4.64 MIL/uL (ref 3.87–5.11)
RDW: 12.9 % (ref 11.5–15.5)
WBC: 6 K/uL (ref 4.0–10.5)

## 2011-06-14 LAB — COMPREHENSIVE METABOLIC PANEL
AST: 26 U/L (ref 0–37)
Albumin: 4.2 g/dL (ref 3.5–5.2)
Alkaline Phosphatase: 59 U/L (ref 39–117)
BUN: 14 mg/dL (ref 6–23)
CO2: 28 mEq/L (ref 19–32)
Chloride: 101 mEq/L (ref 96–112)
Potassium: 3.9 mEq/L (ref 3.5–5.1)
Total Bilirubin: 0.9 mg/dL (ref 0.3–1.2)

## 2011-06-14 LAB — DIFFERENTIAL
Basophils Absolute: 0 K/uL (ref 0.0–0.1)
Basophils Relative: 1 % (ref 0–1)
Eosinophils Absolute: 0.3 K/uL (ref 0.0–0.7)
Eosinophils Relative: 5 % (ref 0–5)
Lymphocytes Relative: 34 % (ref 12–46)
Lymphs Abs: 2 K/uL (ref 0.7–4.0)
Monocytes Absolute: 0.5 K/uL (ref 0.1–1.0)
Monocytes Relative: 9 % (ref 3–12)
Neutro Abs: 3.1 K/uL (ref 1.7–7.7)
Neutrophils Relative %: 52 % (ref 43–77)

## 2011-06-14 NOTE — Patient Instructions (Addendum)
[address]  06/14/2011   Your procedure is scheduled on:  06/18/11  Surgery 1100-1230   Tuesday  Report to [facility] at  0830 AM.  Call this number if you have problems the morning of surgery: [phone]   Or   PST   Deliah Goody  [phone] if questions   Remember:   Do not eat food:After Midnight.  Monday night  May have clear liquids:until Midnight .  Monday night  Clear liquids include soda, tea, black coffee, apple or grape juice, broth.  Take these medicines the morning of surgery with A SIP OF WATER: Benadryl if needed with sip water  Do not wear jewelry, make-up or nail polish.  Do not wear lotions, powders, or perfumes. You may wear deodorant.  Do not shave 48 hours prior to surgery.  Do not bring valuables to the hospital.  Contacts, dentures or bridgework may not be worn into surgery.  Leave suitcase in the car. After surgery it may be brought to your room.  For patients admitted to the hospital, checkout time is 11:00 AM the day of discharge.             Stop aspirin, anti inflammarories and herbal meds   5 days before surgery  Patients discharged the day of surgery will not be allowed to drive home.  Name and phone number of your driver: husband  Special Instructions: CHG Shower Use Special Wash: 1/2 bottle night before surgery and 1/2 bottle morning of surgery.              Regular soap face and privates,   NO shaving x 48 hours  Please read over the following fact sheets that you were given: MRSA Information

## 2011-06-18 ENCOUNTER — Encounter (HOSPITAL_COMMUNITY): Payer: Self-pay

## 2011-06-18 ENCOUNTER — Ambulatory Visit (HOSPITAL_COMMUNITY)
Admission: RE | Admit: 2011-06-18 | Discharge: 2011-06-18 | Disposition: A | Discharge status: Home / Self Care | Payer: Managed Care, Other (non HMO) | Source: Ambulatory Visit | Attending: Surgery | Admitting: Surgery

## 2011-06-18 ENCOUNTER — Ambulatory Visit (HOSPITAL_COMMUNITY): Payer: Managed Care, Other (non HMO)

## 2011-06-18 ENCOUNTER — Other Ambulatory Visit (INDEPENDENT_AMBULATORY_CARE_PROVIDER_SITE_OTHER): Payer: Self-pay | Admitting: Surgery

## 2011-06-18 ENCOUNTER — Encounter (HOSPITAL_COMMUNITY): Payer: Self-pay | Admitting: Anesthesiology

## 2011-06-18 ENCOUNTER — Ambulatory Visit (HOSPITAL_COMMUNITY): Payer: Managed Care, Other (non HMO) | Admitting: Anesthesiology

## 2011-06-18 ENCOUNTER — Encounter (HOSPITAL_COMMUNITY)
Admission: RE | Disposition: A | Discharge status: Home / Self Care | Payer: Self-pay | Source: Ambulatory Visit | Attending: Surgery

## 2011-06-18 DIAGNOSIS — Z90710 Acquired absence of both cervix and uterus: Secondary | ICD-10-CM | POA: Insufficient documentation

## 2011-06-18 DIAGNOSIS — M8580 Other specified disorders of bone density and structure, unspecified site: Secondary | ICD-10-CM

## 2011-06-18 DIAGNOSIS — F3289 Other specified depressive episodes: Secondary | ICD-10-CM

## 2011-06-18 DIAGNOSIS — K8000 Calculus of gallbladder with acute cholecystitis without obstruction: Secondary | ICD-10-CM

## 2011-06-18 DIAGNOSIS — K219 Gastro-esophageal reflux disease without esophagitis: Secondary | ICD-10-CM | POA: Insufficient documentation

## 2011-06-18 DIAGNOSIS — K589 Irritable bowel syndrome without diarrhea: Secondary | ICD-10-CM

## 2011-06-18 DIAGNOSIS — Z1231 Encounter for screening mammogram for malignant neoplasm of breast: Secondary | ICD-10-CM

## 2011-06-18 DIAGNOSIS — L719 Rosacea, unspecified: Secondary | ICD-10-CM

## 2011-06-18 DIAGNOSIS — F329 Major depressive disorder, single episode, unspecified: Secondary | ICD-10-CM

## 2011-06-18 DIAGNOSIS — K8020 Calculus of gallbladder without cholecystitis without obstruction: Secondary | ICD-10-CM

## 2011-06-18 DIAGNOSIS — I10 Essential (primary) hypertension: Secondary | ICD-10-CM | POA: Insufficient documentation

## 2011-06-18 DIAGNOSIS — J301 Allergic rhinitis due to pollen: Secondary | ICD-10-CM

## 2011-06-18 DIAGNOSIS — R1013 Epigastric pain: Secondary | ICD-10-CM | POA: Insufficient documentation

## 2011-06-18 DIAGNOSIS — R1011 Right upper quadrant pain: Secondary | ICD-10-CM | POA: Insufficient documentation

## 2011-06-18 DIAGNOSIS — K8010 Calculus of gallbladder with chronic cholecystitis without obstruction: Secondary | ICD-10-CM

## 2011-06-18 DIAGNOSIS — Z79899 Other long term (current) drug therapy: Secondary | ICD-10-CM | POA: Insufficient documentation

## 2011-06-18 DIAGNOSIS — Z780 Asymptomatic menopausal state: Secondary | ICD-10-CM

## 2011-06-18 HISTORY — PX: CHOLECYSTECTOMY: SHX55

## 2011-06-18 SURGERY — LAPAROSCOPIC CHOLECYSTECTOMY WITH INTRAOPERATIVE CHOLANGIOGRAM
Anesthesia: General | Site: Abdomen | Wound class: Clean Contaminated

## 2011-06-18 MED ORDER — PROMETHAZINE HCL 25 MG/ML IJ SOLN
INTRAMUSCULAR | Status: AC
  Filled 2011-06-18: qty 1

## 2011-06-18 MED ORDER — MIDAZOLAM HCL 5 MG/5ML IJ SOLN
INTRAMUSCULAR | Status: DC | PRN
  Administered 2011-06-18: 1 mg via INTRAVENOUS
  Administered 2011-06-18: 2 mg via INTRAVENOUS

## 2011-06-18 MED ORDER — PROMETHAZINE HCL 25 MG/ML IJ SOLN
6.2500 mg | INTRAMUSCULAR | Status: DC | PRN
  Administered 2011-06-18: 6.25 mg via INTRAVENOUS

## 2011-06-18 MED ORDER — NEOSTIGMINE METHYLSULFATE 1 MG/ML IJ SOLN
INTRAMUSCULAR | Status: DC | PRN
  Administered 2011-06-18: 4 mg via INTRAVENOUS

## 2011-06-18 MED ORDER — LIDOCAINE HCL (CARDIAC) 20 MG/ML IV SOLN
INTRAVENOUS | Status: DC | PRN
  Administered 2011-06-18: 100 mg via INTRAVENOUS

## 2011-06-18 MED ORDER — BUPIVACAINE-EPINEPHRINE 0.25% -1:200000 IJ SOLN
INTRAMUSCULAR | Status: DC | PRN
  Administered 2011-06-18: 20 mL

## 2011-06-18 MED ORDER — CEFAZOLIN SODIUM-DEXTROSE 2-3 GM-% IV SOLR
2.0000 g | Freq: Once | INTRAVENOUS | Status: DC
  Filled 2011-06-18: qty 50

## 2011-06-18 MED ORDER — ONDANSETRON HCL 4 MG/2ML IJ SOLN
INTRAMUSCULAR | Status: DC | PRN
  Administered 2011-06-18: 4 mg via INTRAVENOUS

## 2011-06-18 MED ORDER — IOHEXOL 300 MG/ML  SOLN
INTRAMUSCULAR | Status: AC
  Filled 2011-06-18: qty 1

## 2011-06-18 MED ORDER — BUPIVACAINE-EPINEPHRINE PF 0.25-1:200000 % IJ SOLN
INTRAMUSCULAR | Status: AC
  Filled 2011-06-18: qty 30

## 2011-06-18 MED ORDER — IOHEXOL 300 MG/ML  SOLN
INTRAMUSCULAR | Status: DC | PRN
  Administered 2011-06-18: 5 mL via INTRAVENOUS

## 2011-06-18 MED ORDER — HYDROMORPHONE HCL PF 1 MG/ML IJ SOLN
INTRAMUSCULAR | Status: AC
  Filled 2011-06-18: qty 1

## 2011-06-18 MED ORDER — GLYCOPYRROLATE 0.2 MG/ML IJ SOLN
INTRAMUSCULAR | Status: DC | PRN
  Administered 2011-06-18: .6 mg via INTRAVENOUS

## 2011-06-18 MED ORDER — SUFENTANIL CITRATE 50 MCG/ML IV SOLN
INTRAVENOUS | Status: DC | PRN
  Administered 2011-06-18: 15 ug via INTRAVENOUS
  Administered 2011-06-18: 10 ug via INTRAVENOUS
  Administered 2011-06-18: 5 ug via INTRAVENOUS
  Administered 2011-06-18: 10 ug via INTRAVENOUS

## 2011-06-18 MED ORDER — EPHEDRINE SULFATE 50 MG/ML IJ SOLN
INTRAMUSCULAR | Status: DC | PRN
  Administered 2011-06-18: 5 mg via INTRAVENOUS

## 2011-06-18 MED ORDER — ROCURONIUM BROMIDE 100 MG/10ML IV SOLN
INTRAVENOUS | Status: DC | PRN
  Administered 2011-06-18: 40 mg via INTRAVENOUS

## 2011-06-18 MED ORDER — CEFAZOLIN SODIUM 1-5 GM-% IV SOLN
INTRAVENOUS | Status: AC
  Filled 2011-06-18: qty 50

## 2011-06-18 MED ORDER — HYDROMORPHONE HCL PF 1 MG/ML IJ SOLN
0.2500 mg | INTRAMUSCULAR | Status: DC | PRN
  Administered 2011-06-18 (×2): 0.5 mg via INTRAVENOUS

## 2011-06-18 MED ORDER — PROPOFOL 10 MG/ML IV EMUL
INTRAVENOUS | Status: DC | PRN
  Administered 2011-06-18: 170 mg via INTRAVENOUS
  Administered 2011-06-18: 30 mg via INTRAVENOUS

## 2011-06-18 MED ORDER — OXYCODONE-ACETAMINOPHEN 5-325 MG PO TABS: 1.0000 | ORAL_TABLET | ORAL | Status: AC | PRN

## 2011-06-18 MED ORDER — PHENYLEPHRINE HCL 10 MG/ML IJ SOLN
INTRAMUSCULAR | Status: DC | PRN
  Administered 2011-06-18 (×2): 80 ug via INTRAVENOUS

## 2011-06-18 MED ORDER — LACTATED RINGERS IV SOLN
INTRAVENOUS | Status: DC
  Administered 2011-06-18: 1000 mL via INTRAVENOUS
  Administered 2011-06-18: 13:00:00 via INTRAVENOUS

## 2011-06-18 SURGICAL SUPPLY — 36 items
APPLIER CLIP ROT 10 11.4 M/L (STAPLE) ×2
CANISTER SUCTION 2500CC (MISCELLANEOUS) ×2 IMPLANT
CHLORAPREP W/TINT 26ML (MISCELLANEOUS) ×2 IMPLANT
CLIP APPLIE ROT 10 11.4 M/L (STAPLE) ×1 IMPLANT
CLOTH BEACON ORANGE TIMEOUT ST (SAFETY) ×2 IMPLANT
COVER MAYO STAND STRL (DRAPES) ×2 IMPLANT
DECANTER SPIKE VIAL GLASS SM (MISCELLANEOUS) ×2 IMPLANT
DERMABOND ADVANCED (GAUZE/BANDAGES/DRESSINGS) ×2
DERMABOND ADVANCED .7 DNX12 (GAUZE/BANDAGES/DRESSINGS) ×2 IMPLANT
DRAPE C-ARM 42X72 X-RAY (DRAPES) ×2 IMPLANT
DRAPE LAPAROSCOPIC ABDOMINAL (DRAPES) ×2 IMPLANT
DRAPE WARM FLUID 44X44 (DRAPE) IMPLANT
ELECT REM PT RETURN 9FT ADLT (ELECTROSURGICAL) ×2
ELECTRODE REM PT RTRN 9FT ADLT (ELECTROSURGICAL) ×1 IMPLANT
FILTER SMOKE EVAC LAPAROSHD (FILTER) ×2 IMPLANT
GLOVE BIOGEL PI IND STRL 7.0 (GLOVE) ×1 IMPLANT
GLOVE BIOGEL PI INDICATOR 7.0 (GLOVE) ×1
GLOVE INDICATOR 8.0 STRL GRN (GLOVE) ×4 IMPLANT
GLOVE SS BIOGEL STRL SZ 8 (GLOVE) ×1 IMPLANT
GLOVE SUPERSENSE BIOGEL SZ 8 (GLOVE) ×1
GOWN STRL NON-REIN LRG LVL3 (GOWN DISPOSABLE) ×2 IMPLANT
GOWN STRL REIN XL XLG (GOWN DISPOSABLE) ×4 IMPLANT
HEMOSTAT SNOW SURGICEL 2X4 (HEMOSTASIS) ×2 IMPLANT
HEMOSTAT SURGICEL 4X8 (HEMOSTASIS) IMPLANT
KIT BASIN OR (CUSTOM PROCEDURE TRAY) ×2 IMPLANT
POUCH SPECIMEN RETRIEVAL 10MM (ENDOMECHANICALS) ×2 IMPLANT
SCISSORS LAP 5X35 DISP (ENDOMECHANICALS) ×2 IMPLANT
SET CHOLANGIOGRAPH MIX (MISCELLANEOUS) ×2 IMPLANT
SET IRRIG TUBING LAPAROSCOPIC (IRRIGATION / IRRIGATOR) ×2 IMPLANT
SUT MNCRL AB 4-0 PS2 18 (SUTURE) ×2 IMPLANT
TOWEL OR 17X26 10 PK STRL BLUE (TOWEL DISPOSABLE) ×2 IMPLANT
TRAY LAP CHOLE (CUSTOM PROCEDURE TRAY) ×2 IMPLANT
TROCAR BLADELESS OPT 5 75 (ENDOMECHANICALS) ×4 IMPLANT
TROCAR XCEL BLUNT TIP 100MML (ENDOMECHANICALS) ×2 IMPLANT
TROCAR XCEL NON-BLD 11X100MML (ENDOMECHANICALS) ×2 IMPLANT
TUBING INSUFFLATION 10FT LAP (TUBING) ×2 IMPLANT

## 2011-06-18 NOTE — Anesthesia Postprocedure Evaluation (Signed)
  Anesthesia Post-op Note  Patient: Marie Kennedy  Procedure(s) Performed:  LAPAROSCOPIC CHOLECYSTECTOMY WITH INTRAOPERATIVE CHOLANGIOGRAM - lap chole with IOC  Patient Location: PACU  Anesthesia Type: General  Level of Consciousness: oriented and sedated  Airway and Oxygen Therapy: Patient Spontanous Breathing  Post-op Pain: none  Post-op Assessment: Post-op Vital signs reviewed, Patient's Cardiovascular Status Stable, Respiratory Function Stable and Patent Airway  Post-op Vital Signs: stable  Complications: No apparent anesthesia complications

## 2011-06-18 NOTE — Anesthesia Postprocedure Evaluation (Signed)
  Anesthesia Post-op Note  Patient: Marie Kennedy  Procedure(s) Performed:  LAPAROSCOPIC CHOLECYSTECTOMY WITH INTRAOPERATIVE CHOLANGIOGRAM - lap chole with IOC  Patient Location: PACU  Anesthesia Type: General  Level of Consciousness: oriented and sedated  Airway and Oxygen Therapy: Patient Spontanous Breathing and Patient connected to nasal cannula oxygen  Post-op Pain: mild  Post-op Assessment: Post-op Vital signs reviewed, Patient's Cardiovascular Status Stable, Respiratory Function Stable and Patent Airway  Post-op Vital Signs: stable  Complications: No apparent anesthesia complications

## 2011-06-18 NOTE — Transfer of Care (Signed)
Immediate Anesthesia Transfer of Care Note  Patient: Marie Kennedy  Procedure(s) Performed:  LAPAROSCOPIC CHOLECYSTECTOMY WITH INTRAOPERATIVE CHOLANGIOGRAM - lap chole with IOC  Patient Location: PACU  Anesthesia Type: General  Level of Consciousness: awake, alert , oriented and responds to stimulation  Airway & Oxygen Therapy: Patient Spontanous Breathing and Patient connected to face mask oxygen  Post-op Assessment: Report given to PACU RN and Post -op Vital signs reviewed and stable  Post vital signs: stable  Complications: No apparent anesthesia complications

## 2011-06-18 NOTE — Anesthesia Preprocedure Evaluation (Addendum)
Anesthesia Evaluation  Patient identified by MRN, date of birth, ID band Patient awake    Reviewed: Allergy & Precautions, H&P , NPO status , Patient's Chart, lab work & pertinent test results, reviewed documented beta blocker date and time   Airway Mallampati: I TM Distance: >3 FB Neck ROM: Full    Dental  (+) Caps   Pulmonary neg pulmonary ROS,  clear to auscultation        Cardiovascular hypertension, Pt. on medications Regular Normal Denies cardiac symptoms   Neuro/Psych Negative Neurological ROS  Negative Psych ROS   GI/Hepatic negative GI ROS, Gallstones   Endo/Other  Negative Endocrine ROS  Renal/GU negative Renal ROS  Genitourinary negative   Musculoskeletal negative musculoskeletal ROS (+)   Abdominal   Peds negative pediatric ROS (+)  Hematology negative hematology ROS (+)   Anesthesia Other Findings Lower front caps  Reproductive/Obstetrics negative OB ROS                           Anesthesia Physical Anesthesia Plan  ASA: II  Anesthesia Plan: General   Post-op Pain Management:    Induction: Intravenous  Airway Management Planned: Oral ETT  Additional Equipment:   Intra-op Plan:   Post-operative Plan: Extubation in OR  Informed Consent: I have reviewed the patients History and Physical, chart, labs and discussed the procedure including the risks, benefits and alternatives for the proposed anesthesia with the patient or authorized representative who has indicated his/her understanding and acceptance.   Dental advisory given  Plan Discussed with: CRNA and Surgeon  Anesthesia Plan Comments:        Anesthesia Quick Evaluation

## 2011-06-18 NOTE — Interval H&P Note (Signed)
History and Physical Interval Note:  06/18/2011 10:59 AM  Marie Kennedy  has presented today for surgery, with the diagnosis of gallstones  The various methods of treatment have been discussed with the patient and family. After consideration of risks, benefits and other options for treatment, the patient has consented to  Procedure(s): LAPAROSCOPIC CHOLECYSTECTOMY WITH INTRAOPERATIVE CHOLANGIOGRAM as a surgical intervention .  The patients' history has been reviewed, patient examined, no change in status, stable for surgery.  I have reviewed the patients' chart and labs.  Questions were answered to the patient's satisfaction.     Kellen Hover A.

## 2011-06-18 NOTE — Op Note (Signed)
Laparoscopic Cholecystectomy with IOC Procedure Note  Indications: This patient presents with symptomatic gallbladder disease and will undergo laparoscopic cholecystectomy.  Pre-operative Diagnosis: Calculus of gallbladder with other cholecystitis, without mention of obstruction  Post-operative Diagnosis: Same  Surgeon: Lindalee Huizinga A.   Assistants: or  Anesthesia: General endotracheal anesthesia and Local anesthesia 0.25.% bupivacaine, with epinephrine  ASA Class: 2  Procedure Details  The patient was seen again in the Holding Room. The risks, benefits, complications, treatment options, and expected outcomes were discussed with the patient. The possibilities of reaction to medication, pulmonary aspiration, perforation of viscus, bleeding, recurrent infection, finding a normal gallbladder, the need for additional procedures, failure to diagnose a condition, the possible need to convert to an open procedure, and creating a complication requiring transfusion or operation were discussed with the patient. The patient and/or family concurred with the proposed plan, giving informed consent. The site of surgery properly noted/marked. The patient was taken to Operating Room, identified as Marie Kennedy and the procedure verified as Laparoscopic Cholecystectomy with Intraoperative Cholangiograms. A Time Out was held and the above information confirmed.  Prior to the induction of general anesthesia, antibiotic prophylaxis was administered. General endotracheal anesthesia was then administered and tolerated well. After the induction, the abdomen was prepped in the usual sterile fashion. The patient was positioned in the supine position with the left arm comfortably tucked, along with some reverse Trendelenburg.  Local anesthetic agent was injected into the skin near the umbilicus and an incision made. The midline fascia was incised and the Hasson technique was used to introduce a 10 mm port under direct  vision. It was secured with a figure of eight Vicryl suture placed in the usual fashion. Pneumoperitoneum was then created with CO2 and tolerated well without any adverse changes in the patient's vital signs. Additional trocars were introduced under direct vision. All skin incisions were infiltrated with a local anesthetic agent before making the incision and placing the trocars.   The gallbladder was identified, the fundus grasped and retracted cephalad. Adhesions were lysed bluntly and with the electrocautery where indicated, taking care not to injure any adjacent organs or viscus. The infundibulum was grasped and retracted laterally, exposing the peritoneum overlying the triangle of Calot. This was then divided and exposed in a blunt fashion. The cystic duct was clearly identified and bluntly dissected circumferentially. The junctions of the gallbladder, cystic duct and common bile duct were clearly identified prior to the division of any linear structure and photo documented.   An incision was made in the cystic duct and the cholangiogram catheter introduced. The catheter was secured using an endoclip. The study showed no stones and good visualization of the distal and proximal biliary tree. The catheter was then removed.   The cystic duct was then ligated with surgical clips (4)  on the patient side and   clipped on the gallbladder side and divided. The cystic artery was identified, dissected free, ligated with clips and divided as well.   The gallbladder was dissected from the liver bed in retrograde fashion with the electrocautery. The gallbladder was removed. The liver bed was irrigated and inspected. Hemostasis was achieved with the electrocautery. Copious irrigation was utilized and was repeatedly aspirated until clear all particulate matter.  Pneumoperitoneum was completely reduced after viewing removal of the trocars under direct vision. The wound was thoroughly irrigated and the fascia was  then closed with a figure of eight suture; the skin was then closed with 4 0 monocryl and a sterile dressing  was applied.  Instrument, sponge, and needle counts were correct at closure and at the conclusion of the case.   Findings: Cholecystitis with Cholelithiasis  Estimated Blood Loss: less than 50 mL         Drains: none         Total IV Fluids:         Specimens: Gallbladder           Complications: None; patient tolerated the procedure well.         Disposition: PACU - hemodynamically stable.         Condition: stable

## 2011-06-18 NOTE — H&P (Signed)
Progress Notes   Marie Kennedy (MR# [id number])       Progress Notes Info       Author Note Status Last Update User Last Update Date/Time    Maisie Fus A. Excell Neyland, MD Signed Clovis Pu. Victorine Mcnee, MD 06/18/2011     Progress Notes     Chief Complaint   Patient presents with   .  Other       new pt- eval gb with stones        HPI Marie Kennedy is a 65 y.o. female. HPI The patient presents today due to epigastric abdominal pain. About 2 weeks ago, she had a episode of severe epigastric pain which radiated to her right upper quadrant that lasted about 6 hours. This was after dinner. She had a fatty meal prior to this. The pain was severe in nature and short period it was associated with nausea but no vomiting. She has had no change in bowel or bladder function. She is sent today at the request of Dr. Milinda Antis.    Past Medical History   Diagnosis  Date   .  Depression     .  Hypertension     .  Rosacea     .  IBS (irritable bowel syndrome)     .  Fibroids  1993       cysts endometriosis   .  GERD (gastroesophageal reflux disease)           Past Surgical History   Procedure  Date   .  Breast surgery  1970's       lumpectomy no cancer   .  Abdominal hysterectomy  1993         Family History   Problem  Relation  Age of Onset   .  Hypertension  Mother     .  Cancer  Mother         breast, uterine, and cervical   .  Heart disease  Father         CAD   .  Hypertension  Father     .  Diabetes  Father         TYPE II   .  Cancer  Father         brain        Social History History   Substance Use Topics   .  Smoking status:  Never Smoker    .  Smokeless tobacco:  Not on file   .  Alcohol Use:  No        No Known Allergies    Current Outpatient Prescriptions   Medication  Sig  Dispense  Refill   .  Calcium Carbonate-Vit D-Min 600-400 MG-UNIT TABS  Take 1 tablet by mouth daily.           .  DiphenhydrAMINE HCl (ALLERGY MED PO)  Take 1 tablet by mouth daily as  needed.           .  hyoscyamine (LEVSIN SL) 0.125 MG SL tablet  Place 1 tablet (0.125 mg total) under the tongue 2 (two) times daily as needed for cramping. Abdominal cramping   30 tablet   11   .  lisinopril-hydrochlorothiazide (PRINZIDE,ZESTORETIC) 10-12.5 MG per tablet  Take 0.5 tablets by mouth daily.   90 tablet   3   .  metroNIDAZOLE (METROCREAM) 0.75 % cream  Apply topically daily. To affected area.          Marland Kitchen  Multiple Vitamin (MULTIVITAMIN) tablet  Take 1 tablet by mouth daily.                Review of Systems Review of Systems  Constitutional: Negative for fever, activity change and appetite change.  HENT: Negative.   Eyes: Negative.   Respiratory: Negative for cough and shortness of breath.   Cardiovascular: Negative for chest pain and palpitations.  Gastrointestinal: Positive for nausea and abdominal pain. Negative for vomiting.  Genitourinary: Negative.   Musculoskeletal: Negative.   Skin: Negative.   Neurological: Negative.   Hematological: Negative.   Psychiatric/Behavioral: Negative.       Blood pressure 132/88, pulse 68, temperature 98.8 F (37.1 C), temperature source Temporal, resp. rate 16, height 5\' 4"  (1.626 m), weight 140 lb 3.2 oz (63.594 kg).   Physical Exam Physical Exam  Constitutional: She is oriented to person, place, and time. She appears well-developed and well-nourished.  HENT:   Head: Normocephalic and atraumatic.   Nose: Nose normal.  Eyes: EOM are normal. Pupils are equal, round, and reactive to light.  Neck: Normal range of motion. Neck supple.  Cardiovascular: Normal rate and regular rhythm.    No murmur heard. Pulmonary/Chest: Effort normal and breath sounds normal.  Abdominal: Soft. Bowel sounds are normal. There is no tenderness.  Musculoskeletal: Normal range of motion.  Neurological: She is alert and oriented to person, place, and time.  Skin: Skin is warm and dry.  Psychiatric: She has a normal mood and affect. Her behavior is  normal. Judgment and thought content normal.      Data Reviewed Dr Milinda Antis notes. U/s  Gallstones without gallbladder wall thickening.  CBD 4.4  MM   Assessment    Biliary Colic     Plan   Laparoscopic cholecystectomy with cholangiogram. The procedure has been discussed with the patient. Operative and non operative treatments have been discussed. Risks of surgery include bleeding, iInfection,  Common bile duct injury,  Injury to the stomach,liver, colon,small intestine, abdominal wall,  Diaphragm,  Major blood vessels,  And the need for an open procedure.  Other risks include worsening of medical problems, death,  DVT and pulmonary embolism, and cardiovascular events.   Medical options have also been discussed.  The likely hood of achieving the patient goal of  symptom resolution is moderate. The patient has been informed of long term expectations of surgery and non surgical options,  The patient agrees to proceed.            Kostas Marrow A. 06/18/11

## 2011-06-18 NOTE — Preoperative (Signed)
Beta Blockers   Reason not to administer Beta Blockers:Not Applicable 

## 2011-06-18 NOTE — Interval H&P Note (Signed)
History and Physical Interval Note:  06/18/2011 11:00 AM  Marie Kennedy  has presented today for surgery, with the diagnosis of gallstones  The various methods of treatment have been discussed with the patient and family. After consideration of risks, benefits and other options for treatment, the patient has consented to  Procedure(s): LAPAROSCOPIC CHOLECYSTECTOMY WITH INTRAOPERATIVE CHOLANGIOGRAM as a surgical intervention .  The patients' history has been reviewed, patient examined, no change in status, stable for surgery.  I have reviewed the patients' chart and labs.  Questions were answered to the patient's satisfaction.     Shaily Librizzi A.

## 2011-06-19 ENCOUNTER — Encounter (HOSPITAL_COMMUNITY): Payer: Self-pay | Admitting: Surgery

## 2011-07-11 ENCOUNTER — Ambulatory Visit (INDEPENDENT_AMBULATORY_CARE_PROVIDER_SITE_OTHER): Payer: Managed Care, Other (non HMO) | Admitting: Surgery

## 2011-07-11 ENCOUNTER — Encounter (INDEPENDENT_AMBULATORY_CARE_PROVIDER_SITE_OTHER): Payer: Self-pay | Admitting: Surgery

## 2011-07-11 VITALS — BP 134/58 | HR 75 | Temp 98.8°F | Ht 65.0 in | Wt 137.2 lb

## 2011-07-11 DIAGNOSIS — Z9889 Other specified postprocedural states: Secondary | ICD-10-CM

## 2011-07-11 NOTE — Progress Notes (Signed)
she is here for a postop visit following laparoscopic cholecystectomy.  Diet is being tolerated, bowels are moving.  No problems with incisions.  PE:  ABD:  Soft, incisions clean/dry/intact and solid.  Assessment:  Doing well postop.  Plan:  Lowfat diet recommended.  Activities as tolerated.  Return visit prn. 

## 2011-07-11 NOTE — Patient Instructions (Signed)
Follow up as needed

## 2011-12-16 ENCOUNTER — Other Ambulatory Visit: Payer: Self-pay | Admitting: *Deleted

## 2011-12-16 MED ORDER — HYOSCYAMINE SULFATE 0.125 MG SL SUBL: 0.1250 mg | SUBLINGUAL_TABLET | Freq: Two times a day (BID) | SUBLINGUAL | Status: DC | PRN

## 2011-12-16 MED ORDER — LISINOPRIL-HYDROCHLOROTHIAZIDE 10-12.5 MG PO TABS: 0.5000 | ORAL_TABLET | ORAL | Status: DC

## 2011-12-16 NOTE — Telephone Encounter (Signed)
Will refill electronically  

## 2011-12-16 NOTE — Telephone Encounter (Signed)
Received faxed refill request from pharmacy. Last office visit 03/29/11. Is it okay to refill medication?

## 2012-01-29 ENCOUNTER — Ambulatory Visit (INDEPENDENT_AMBULATORY_CARE_PROVIDER_SITE_OTHER): Payer: Managed Care, Other (non HMO) | Admitting: Family Medicine

## 2012-01-29 ENCOUNTER — Encounter: Payer: Self-pay | Admitting: Family Medicine

## 2012-01-29 VITALS — BP 134/73 | HR 71 | Temp 98.3°F | Ht 65.0 in | Wt 137.5 lb

## 2012-01-29 DIAGNOSIS — I10 Essential (primary) hypertension: Secondary | ICD-10-CM

## 2012-01-29 DIAGNOSIS — K2970 Gastritis, unspecified, without bleeding: Secondary | ICD-10-CM | POA: Insufficient documentation

## 2012-01-29 DIAGNOSIS — K2990 Gastroduodenitis, unspecified, without bleeding: Secondary | ICD-10-CM

## 2012-01-29 DIAGNOSIS — Z1231 Encounter for screening mammogram for malignant neoplasm of breast: Secondary | ICD-10-CM

## 2012-01-29 MED ORDER — LISINOPRIL-HYDROCHLOROTHIAZIDE 10-12.5 MG PO TABS: 0.5000 | ORAL_TABLET | ORAL | Status: DC

## 2012-01-29 MED ORDER — OMEPRAZOLE 20 MG PO CPDR: 20.0000 mg | DELAYED_RELEASE_CAPSULE | Freq: Every day | ORAL | Status: DC

## 2012-01-29 MED ORDER — HYOSCYAMINE SULFATE 0.125 MG SL SUBL: 0.1250 mg | SUBLINGUAL_TABLET | Freq: Two times a day (BID) | SUBLINGUAL | Status: DC | PRN

## 2012-01-29 NOTE — Assessment & Plan Note (Signed)
Suspect nsaid and stress induced  Is doing well with diet Will try omeprazole 20 in ams and update if not imp  F/u for annual exam

## 2012-01-29 NOTE — Assessment & Plan Note (Signed)
bp in fair control at this time  No changes needed  Disc lifstyle change with low sodium diet and exercise   Rev labs from December Good health habits Will plan next annual exam

## 2012-01-29 NOTE — Patient Instructions (Addendum)
For gastritis - take omeprazole 20 mg once daily in am  Keep diet bland for now  Give up all nsaids (aspirin, ibuprofen/ aleve) Tylenol is ok  Drink lots of water Update me if not improved in a couple of weeks  Schedule annual exam with labs prior in fall/ winter If you are interested in a shingles/zoster vaccine - call your insurance to check on coverage,( you should not get it within 1 month of other vaccines) , then call us for a prescription  for it to take to a pharmacy that gives the shot

## 2012-01-29 NOTE — Progress Notes (Signed)
Subjective:    Patient ID: Marie Kennedy, female    DOB: 1946/05/26, 66 y.o.   MRN: [id number]  HPI Here for f/u of chronic conditions and med refil  Is feeling well    Had ccy with cholangiogram since last visit Glad it is over  Abdominal pain on R is gone  Still has some stomach pain  This is different , not as severe  Has not told her surgeon  Is pressure - in epigastric area-? Some kind of gas hyoscamine helps some  Takes a lot of gaviscon  Has problem if she overeats  Has given up on onions/ peppers/ red foods/ acidic foods / and soda  Does occ take nsaids  advil  On average 3-5 days per week - 1 -2 per day  Loose stools from time to time    bp is ok    Today BP Readings from Last 3 Encounters:  01/29/12 134/73  07/11/11 134/58  06/18/11 112/68    No cp or palpitations or headaches or edema  No side effects to medicines    Chemistry      Component Value Date/Time   NA 138 06/14/2011 0910   K 3.9 06/14/2011 0910   CL 101 06/14/2011 0910   CO2 28 06/14/2011 0910   BUN 14 06/14/2011 0910   CREATININE 0.78 06/14/2011 0910      Component Value Date/Time   CALCIUM 10.5 06/14/2011 0910   ALKPHOS 59 06/14/2011 0910   AST 26 06/14/2011 0910   ALT 25 06/14/2011 0910   BILITOT 0.9 06/14/2011 0910     Lab Results  Component Value Date   ALT 25 06/14/2011   AST 26 06/14/2011   ALKPHOS 59 06/14/2011   BILITOT 0.9 06/14/2011      Wt is stable with bmi of 22  Needs to set up mammogram   Patient Active Problem List  Diagnosis  . DEPRESSION  . HYPERTENSION  . ALLERGIC RHINITIS, SEASONAL  . IBS  . ROSACEA  . Other screening mammogram  . Osteopenia  . Post-menopausal  . Gastritis   Past Medical History  Diagnosis Date  . Rosacea   . IBS (irritable bowel syndrome)   . Fibroids 1993    cysts endometriosis  . GERD (gastroesophageal reflux disease)   . Hypertension     PCP   Dr Milinda Antis at [facility]  . Depression     pt denies   Past Surgical History    Procedure Date  . Abdominal hysterectomy 1993  . Breast surgery 1970's    lumpectomy no cancer  . Tonsillectomy   . Cholecystectomy 06/18/2011    Procedure: LAPAROSCOPIC CHOLECYSTECTOMY WITH INTRAOPERATIVE CHOLANGIOGRAM;  Surgeon: Clovis Pu. Cornett, MD;  Location: [facility];  Service: General;  Laterality: N/A;  lap chole with IOC   History  Substance Use Topics  . Smoking status: Never Smoker   . Smokeless tobacco: Not on file  . Alcohol Use: No   Family History  Problem Relation Age of Onset  . Hypertension Mother   . Cancer Mother     breast, uterine, and cervical  . Heart disease Father     CAD  . Hypertension Father   . Diabetes Father     TYPE II  . Cancer Father     brain   No Known Allergies Current Outpatient Prescriptions on File Prior to Visit  Medication Sig Dispense Refill  . Calcium Carbonate-Vit D-Min 600-400 MG-UNIT TABS Take 1 tablet by mouth daily.       Marland Kitchen  DiphenhydrAMINE HCl (ALLERGY MED PO) Take 25 mg by mouth daily as needed. Allergies       . hyoscyamine (LEVSIN SL) 0.125 MG SL tablet Place 1 tablet (0.125 mg total) under the tongue 2 (two) times daily as needed for cramping. Abdominal cramping  30 tablet  3  . lisinopril-hydrochlorothiazide (PRINZIDE,ZESTORETIC) 10-12.5 MG per tablet Take 0.5 tablets by mouth every morning.  90 tablet  2  . metroNIDAZOLE (METROCREAM) 0.75 % cream Apply 1 application topically daily. Rosacea      . Multiple Vitamin (MULTIVITAMIN) tablet Take 1 tablet by mouth daily.       Marland Kitchen omeprazole (PRILOSEC) 20 MG capsule Take 1 capsule (20 mg total) by mouth daily.  30 capsule  5      Review of Systems Review of Systems  Constitutional: Negative for fever, appetite change, fatigue and unexpected weight change.  Eyes: Negative for pain and visual disturbance.  Respiratory: Negative for cough and shortness of breath.   Cardiovascular: Negative for cp or palpitations    Gastrointestinal: Negative for nausea, diarrhea and  constipation. pos for epigastric pain  Genitourinary: Negative for urgency and frequency.  Skin: Negative for pallor or rash   Neurological: Negative for weakness, light-headedness, numbness and headaches.  Hematological: Negative for adenopathy. Does not bruise/bleed easily.  Psychiatric/Behavioral: Negative for dysphoric mood. The patient is not nervous/anxious.         Objective:   Physical Exam  Constitutional: She appears well-developed and well-nourished. No distress.  HENT:  Head: Normocephalic and atraumatic.  Mouth/Throat: Oropharynx is clear and moist.  Eyes: Conjunctivae and EOM are normal. Pupils are equal, round, and reactive to light. No scleral icterus.  Neck: Normal range of motion. Neck supple. No JVD present. Carotid bruit is not present. No thyromegaly present.  Cardiovascular: Normal rate, regular rhythm, normal heart sounds and intact distal pulses.  Exam reveals no gallop.   Pulmonary/Chest: Effort normal and breath sounds normal. No respiratory distress. She has no wheezes.  Abdominal: Soft. Bowel sounds are normal. She exhibits no distension, no abdominal bruit and no mass. There is tenderness. There is no rebound and no guarding.       Mild epigastric  Tenderness without rebound or gaurding  Musculoskeletal: She exhibits no edema.  Lymphadenopathy:    She has no cervical adenopathy.  Neurological: She is alert. No cranial nerve deficit. She exhibits normal muscle tone. Coordination normal.  Skin: Skin is warm and dry. No rash noted. No erythema. No pallor.  Psychiatric: She has a normal mood and affect.          Assessment & Plan:

## 2012-02-18 ENCOUNTER — Ambulatory Visit: Payer: Self-pay | Admitting: Family Medicine

## 2012-02-21 ENCOUNTER — Encounter: Payer: Self-pay | Admitting: Family Medicine

## 2012-06-17 ENCOUNTER — Telehealth: Payer: Self-pay | Admitting: Family Medicine

## 2012-06-17 DIAGNOSIS — M8580 Other specified disorders of bone density and structure, unspecified site: Secondary | ICD-10-CM

## 2012-06-17 DIAGNOSIS — I10 Essential (primary) hypertension: Secondary | ICD-10-CM

## 2012-06-17 NOTE — Telephone Encounter (Signed)
Message copied by Judy Pimple on Wed Jun 17, 2012 10:13 PM ------      Message from: Baldomero Lamy      Created: Wed Jun 17, 2012  8:29 AM      Regarding: Cpx labs tomorrow 06/18/12       Please order  future cpx labs for pt's upcomming lab appt.      Thanks      Rodney Booze

## 2012-06-18 ENCOUNTER — Other Ambulatory Visit (INDEPENDENT_AMBULATORY_CARE_PROVIDER_SITE_OTHER): Payer: Managed Care, Other (non HMO)

## 2012-06-18 DIAGNOSIS — I10 Essential (primary) hypertension: Secondary | ICD-10-CM

## 2012-06-18 DIAGNOSIS — M8580 Other specified disorders of bone density and structure, unspecified site: Secondary | ICD-10-CM

## 2012-06-18 LAB — CBC WITH DIFFERENTIAL/PLATELET
Basophils Absolute: 0 10*3/uL (ref 0.0–0.1)
Eosinophils Absolute: 0.2 10*3/uL (ref 0.0–0.7)
Hemoglobin: 13.2 g/dL (ref 12.0–15.0)
Lymphocytes Relative: 33.7 % (ref 12.0–46.0)
MCHC: 33.2 g/dL (ref 30.0–36.0)
Monocytes Relative: 11.1 % (ref 3.0–12.0)
Neutro Abs: 2.6 10*3/uL (ref 1.4–7.7)
Neutrophils Relative %: 50.1 % (ref 43.0–77.0)
RBC: 4.37 Mil/uL (ref 3.87–5.11)
RDW: 13.4 % (ref 11.5–14.6)

## 2012-06-18 LAB — COMPREHENSIVE METABOLIC PANEL
ALT: 36 U/L — ABNORMAL HIGH (ref 0–35)
AST: 36 U/L (ref 0–37)
Albumin: 4.4 g/dL (ref 3.5–5.2)
CO2: 29 mEq/L (ref 19–32)
Calcium: 9.7 mg/dL (ref 8.4–10.5)
Chloride: 100 mEq/L (ref 96–112)
Creatinine, Ser: 0.9 mg/dL (ref 0.4–1.2)
GFR: 64 mL/min (ref >60.00)
Potassium: 4.1 mEq/L (ref 3.5–5.1)
Total Protein: 7.4 g/dL (ref 6.0–8.3)

## 2012-06-18 LAB — TSH: TSH: 1.36 u[IU]/mL (ref 0.35–5.50)

## 2012-06-18 LAB — LIPID PANEL
HDL: 76.5 mg/dL (ref >39.00)
Total CHOL/HDL Ratio: 2

## 2012-06-19 LAB — VITAMIN D 25 HYDROXY (VIT D DEFICIENCY, FRACTURES): Vit D, 25-Hydroxy: 47 ng/mL (ref 30–89)

## 2012-06-22 ENCOUNTER — Ambulatory Visit (INDEPENDENT_AMBULATORY_CARE_PROVIDER_SITE_OTHER): Payer: Managed Care, Other (non HMO) | Admitting: Family Medicine

## 2012-06-22 ENCOUNTER — Encounter: Payer: Self-pay | Admitting: Family Medicine

## 2012-06-22 VITALS — BP 132/80 | HR 81 | Temp 98.4°F | Ht 62.7 in | Wt 132.8 lb

## 2012-06-22 DIAGNOSIS — I10 Essential (primary) hypertension: Secondary | ICD-10-CM

## 2012-06-22 DIAGNOSIS — M8580 Other specified disorders of bone density and structure, unspecified site: Secondary | ICD-10-CM

## 2012-06-22 DIAGNOSIS — Z23 Encounter for immunization: Secondary | ICD-10-CM

## 2012-06-22 DIAGNOSIS — Z1211 Encounter for screening for malignant neoplasm of colon: Secondary | ICD-10-CM | POA: Insufficient documentation

## 2012-06-22 DIAGNOSIS — M899 Disorder of bone, unspecified: Secondary | ICD-10-CM

## 2012-06-22 DIAGNOSIS — Z1331 Encounter for screening for depression: Secondary | ICD-10-CM

## 2012-06-22 DIAGNOSIS — M949 Disorder of cartilage, unspecified: Secondary | ICD-10-CM

## 2012-06-22 DIAGNOSIS — R17 Unspecified jaundice: Secondary | ICD-10-CM

## 2012-06-22 NOTE — Assessment & Plan Note (Signed)
Ref for colonoscopy - due 10 year  Ref to GI

## 2012-06-22 NOTE — Assessment & Plan Note (Signed)
bp in fair control at this time  No changes needed  Disc lifstyle change with low sodium diet and exercise   Rev labs with pt  

## 2012-06-22 NOTE — Patient Instructions (Addendum)
If you are interested in a shingles/zoster vaccine - call your insurance to check on coverage,( you should not get it within 1 month of other vaccines) , then call us for a prescription  for it to take to a pharmacy that gives the shot , or make a nurse visit to get it here depending on your coverage Tetanus shot today  We will refer you to GI for consult about a colonoscopy and your labs when you check out

## 2012-06-22 NOTE — Assessment & Plan Note (Signed)
Pt had CCY 1 year ago  Bili is 1.5  Will disc with GI at her consult

## 2012-06-22 NOTE — Progress Notes (Signed)
Subjective:    Patient ID: Marie Kennedy, female    DOB: 06/17/1946, 66 y.o.   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 work- started bringing lunch = is eating healthier  Does walk occasionally-no regular exercise   Zoster status-is interested in vaccine    colonosc 1/02- in [place]  Is interested in getting that set up   Flu vaccine-got it in October   Td 1/04- wants to get that today  Not around babies at all   mammo had it in aug 2013  Self exam -no lumps or changes   dexa 8/12 OP Vit D level is 47 No fx at all    Fall hx- none whatsoever   Mood-good /no problems at all an stays motivated   Had a hysterectomy in past - fibroids and ov cyst  No gyn problems or symptoms   Lab Results  Component Value Date   CHOL 185 06/18/2012   CHOL 197 11/13/2009   CHOL 190 10/31/2008   Lab Results  Component Value Date   HDL 76.50 06/18/2012   HDL 84.40 11/13/2009   HDL 65.80 10/31/2008   Lab Results  Component Value Date   LDLCALC 93 06/18/2012   LDLCALC 102* 11/13/2009   LDLCALC 89 10/31/2008   Lab Results  Component Value Date   TRIG 76.0 06/18/2012   TRIG 53.0 11/13/2009   TRIG 176.0* 10/31/2008   Lab Results  Component Value Date   CHOLHDL 2 06/18/2012   CHOLHDL 2 11/13/2009   CHOLHDL 3 10/31/2008   No results found for this basename: LDLDIRECT   very good overall , is a good healthy eater   bp is stable today  No cp or palpitations or headaches or edema  No side effects to medicines  BP Readings from Last 3 Encounters:  06/22/12 132/80  01/29/12 134/73  07/11/11 134/58     Bilirubin is up  Lab Results  Component Value Date   ALT 36* 06/18/2012   AST 36 06/18/2012   ALKPHOS 63 06/18/2012   BILITOT 1.5* 06/18/2012   she had ccy with cholangiogram a year  ago Nothing new or persistant in terms of abd or GI symptoms and no nausea    Patient Active Problem List  Diagnosis  . DEPRESSION  . HYPERTENSION  . ALLERGIC RHINITIS, SEASONAL  . IBS  . ROSACEA  . Other screening mammogram  . Osteopenia  . Post-menopausal  . Gastritis   Past Medical History  Diagnosis Date  . Rosacea   . IBS (irritable bowel syndrome)   . Fibroids 1993    cysts endometriosis  . GERD (gastroesophageal reflux disease)   . Hypertension     PCP   Dr Milinda Antis at [facility]  . Depression     pt denies   Past Surgical History  Procedure Date  . Abdominal hysterectomy 1993  . Breast surgery 1970's    lumpectomy no cancer  . Tonsillectomy   . Cholecystectomy 06/18/2011    Procedure: LAPAROSCOPIC CHOLECYSTECTOMY WITH INTRAOPERATIVE CHOLANGIOGRAM;  Surgeon: Clovis Pu. Cornett, MD;  Location: [facility];  Service: General;  Laterality: N/A;  lap chole with IOC   History  Substance Use Topics  . Smoking status: Never Smoker   .  Smokeless tobacco: Not on file  . Alcohol Use: No   Family History  Problem Relation Age of Onset  . Hypertension Mother   . Cancer Mother     breast, uterine, and cervical  . Heart disease Father     CAD  . Hypertension Father   . Diabetes Father     TYPE II  . Cancer Father     brain   No Known Allergies Current Outpatient Prescriptions on File Prior to Visit  Medication Sig Dispense Refill  . Calcium Carbonate-Vit D-Min 600-400 MG-UNIT TABS Take 1 tablet by mouth daily.       . DiphenhydrAMINE HCl (ALLERGY MED PO) Take 25 mg by mouth daily as needed. Allergies       . hyoscyamine (LEVSIN SL) 0.125 MG SL tablet Place 1 tablet (0.125 mg total) under the tongue 2 (two) times daily as needed for cramping. Abdominal cramping  30 tablet  3  . lisinopril-hydrochlorothiazide (PRINZIDE,ZESTORETIC) 10-12.5 MG per tablet Take 0.5 tablets by mouth every morning.  90 tablet  2  . metroNIDAZOLE (METROCREAM) 0.75 % cream Apply 1  application topically daily. Rosacea      . Multiple Vitamin (MULTIVITAMIN) tablet Take 1 tablet by mouth daily.         Review of Systems Review of Systems  Constitutional: Negative for fever, appetite change, fatigue and unexpected weight change.  Eyes: Negative for pain and visual disturbance.  Respiratory: Negative for cough and shortness of breath.   Cardiovascular: Negative for cp or palpitations    Gastrointestinal: Negative for nausea, diarrhea and constipation.  Genitourinary: Negative for urgency and frequency.  Skin: Negative for pallor or rash   Neurological: Negative for weakness, light-headedness, numbness and headaches.  Hematological: Negative for adenopathy. Does not bruise/bleed easily.  Psychiatric/Behavioral: Negative for dysphoric mood. The patient is not nervous/anxious.         Objective:   Physical Exam  Constitutional: She appears well-developed and well-nourished. No distress.  HENT:  Head: Normocephalic and atraumatic.  Right Ear: External ear normal.  Left Ear: External ear normal.  Nose: Nose normal.  Mouth/Throat: Oropharynx is clear and moist. No oropharyngeal exudate.  Eyes: Conjunctivae normal and EOM are normal. Pupils are equal, round, and reactive to light. Right eye exhibits no discharge. Left eye exhibits no discharge. No scleral icterus.  Neck: Normal range of motion. Neck supple. No JVD present. Carotid bruit is not present. No thyromegaly present.  Cardiovascular: Normal rate, regular rhythm, normal heart sounds and intact distal pulses.  Exam reveals no gallop.   Pulmonary/Chest: Effort normal and breath sounds normal. No respiratory distress. She has no wheezes.  Abdominal: Soft. Bowel sounds are normal. She exhibits no distension, no abdominal bruit and no mass. There is no tenderness.  Genitourinary: No breast swelling, tenderness, discharge or bleeding.       Breast exam: No mass, nodules, thickening, tenderness, bulging, retraction,  inflamation, nipple discharge or skin changes noted.  No axillary or clavicular LA.  Chaperoned exam.    Musculoskeletal: She exhibits no edema and no tenderness.  Lymphadenopathy:    She has no cervical adenopathy.  Neurological: She is alert. She has normal reflexes. No cranial nerve deficit. She exhibits normal muscle tone. Coordination normal.  Skin: Skin is warm and dry. No rash noted. No erythema. No pallor.  Psychiatric: She has a normal mood and affect.          Assessment & Plan:

## 2012-06-22 NOTE — Assessment & Plan Note (Signed)
Rev dexa 2012  Not due until late summer 2014  Rev ca and D D level is nl  No falls or fractures

## 2012-07-03 ENCOUNTER — Ambulatory Visit (INDEPENDENT_AMBULATORY_CARE_PROVIDER_SITE_OTHER): Payer: Managed Care, Other (non HMO) | Admitting: *Deleted

## 2012-07-03 DIAGNOSIS — Z23 Encounter for immunization: Secondary | ICD-10-CM

## 2012-07-03 DIAGNOSIS — Z2911 Encounter for prophylactic immunotherapy for respiratory syncytial virus (RSV): Secondary | ICD-10-CM

## 2013-03-05 ENCOUNTER — Other Ambulatory Visit: Payer: Self-pay | Admitting: *Deleted

## 2013-03-05 MED ORDER — LISINOPRIL-HYDROCHLOROTHIAZIDE 10-12.5 MG PO TABS: 0.5000 | ORAL_TABLET | ORAL | Status: DC

## 2013-03-11 DIAGNOSIS — Z85828 Personal history of other malignant neoplasm of skin: Secondary | ICD-10-CM

## 2013-03-11 DIAGNOSIS — C4491 Basal cell carcinoma of skin, unspecified: Secondary | ICD-10-CM

## 2013-03-11 HISTORY — DX: Basal cell carcinoma of skin, unspecified: C44.91

## 2013-03-11 HISTORY — DX: Personal history of other malignant neoplasm of skin: Z85.828

## 2013-05-08 DIAGNOSIS — D119 Benign neoplasm of major salivary gland, unspecified: Secondary | ICD-10-CM

## 2013-05-08 HISTORY — PX: BASAL CELL CARCINOMA EXCISION: SHX1214

## 2013-05-08 HISTORY — DX: Benign neoplasm of major salivary gland, unspecified: D11.9

## 2013-06-01 DIAGNOSIS — Z85828 Personal history of other malignant neoplasm of skin: Secondary | ICD-10-CM | POA: Insufficient documentation

## 2013-09-17 ENCOUNTER — Other Ambulatory Visit: Payer: Self-pay | Admitting: Family Medicine

## 2013-09-20 ENCOUNTER — Encounter: Payer: Self-pay | Admitting: Family Medicine

## 2013-09-20 ENCOUNTER — Encounter: Payer: Self-pay | Admitting: Internal Medicine

## 2013-09-20 ENCOUNTER — Ambulatory Visit (INDEPENDENT_AMBULATORY_CARE_PROVIDER_SITE_OTHER): Payer: Managed Care, Other (non HMO) | Admitting: Family Medicine

## 2013-09-20 VITALS — BP 130/72 | HR 71 | Temp 98.4°F | Ht 62.75 in | Wt 128.5 lb

## 2013-09-20 DIAGNOSIS — Z1211 Encounter for screening for malignant neoplasm of colon: Secondary | ICD-10-CM

## 2013-09-20 DIAGNOSIS — M949 Disorder of cartilage, unspecified: Secondary | ICD-10-CM

## 2013-09-20 DIAGNOSIS — M8580 Other specified disorders of bone density and structure, unspecified site: Secondary | ICD-10-CM

## 2013-09-20 DIAGNOSIS — I10 Essential (primary) hypertension: Secondary | ICD-10-CM

## 2013-09-20 DIAGNOSIS — M899 Disorder of bone, unspecified: Secondary | ICD-10-CM

## 2013-09-20 MED ORDER — LISINOPRIL-HYDROCHLOROTHIAZIDE 10-12.5 MG PO TABS: 0.5000 | ORAL_TABLET | ORAL | Status: DC

## 2013-09-20 MED ORDER — HYOSCYAMINE SULFATE 0.125 MG SL SUBL: 0.1250 mg | SUBLINGUAL_TABLET | Freq: Two times a day (BID) | SUBLINGUAL | Status: DC | PRN

## 2013-09-20 NOTE — Assessment & Plan Note (Signed)
bp in fair control at this time  BP Readings from Last 1 Encounters:  09/20/13 130/72   No changes needed Disc lifstyle change with low sodium diet and exercise  Lab today

## 2013-09-20 NOTE — Assessment & Plan Note (Signed)
Rev last dexa No falls or fx  Check D level  Disc need for calcium/ vitamin D/ wt bearing exercise and bone density test every 2 y to monitor Disc safety/ fracture risk in detail

## 2013-09-20 NOTE — Patient Instructions (Signed)
Stop at check out to arrange colonoscopy referral  Labs now  Take care of yourself  Walk and take your calcium and vitamin D If you are interested in counseling for stress- please let me know

## 2013-09-20 NOTE — Progress Notes (Signed)
Subjective:    Patient ID: Marie Kennedy, female    DOB: 04-Oct-1945, 68 y.o.   MRN: [id number]  HPI Here for f/u of chronic medical problems  Wt is down 4 lb with bmi of 22  Is not eating much - she has had a lot of stress  Going through a hard time - does not elaborate on this - stress  bp is stable today  No cp or palpitations or headaches or edema  No side effects to medicines  BP Readings from Last 3 Encounters:  09/20/13 130/72  06/22/12 132/80  01/29/12 134/73     Office Visit on 06/22/2012  Component Date Value Ref Range Status  . HM Dexa Scan 02/13/2011 OP   Final    Been a while since lab work  Will do that   No falls or fractures   She has not had her colonoscopy  Due for mammogram  Did get a flu vaccine this season   Had to have some facial surgery and reconstruction after a skin cancer removal- and this is healing    Patient Active Problem List   Diagnosis Date Noted  . Colon cancer screening 06/22/2012  . Hyperbilirubinemia 06/22/2012  . Gastritis 01/29/2012  . Osteopenia 12/12/2010  . Post-menopausal 12/12/2010  . Other screening mammogram 12/04/2010  . ALLERGIC RHINITIS, SEASONAL 11/13/2009  . DEPRESSION 05/04/2007  . HYPERTENSION 05/04/2007  . IBS 05/04/2007  . ROSACEA 05/04/2007   Past Medical History  Diagnosis Date  . Rosacea   . IBS (irritable bowel syndrome)   . Fibroids 1993    cysts endometriosis  . GERD (gastroesophageal reflux disease)   . Hypertension     PCP   Dr Glori Bickers at [facility]  . Depression     pt denies   Past Surgical History  Procedure Laterality Date  . Abdominal hysterectomy  1993  . Breast surgery  1970's    lumpectomy no cancer  . Tonsillectomy    . Cholecystectomy  06/18/2011    Procedure: LAPAROSCOPIC CHOLECYSTECTOMY WITH INTRAOPERATIVE CHOLANGIOGRAM;  Surgeon: Joyice Faster. Cornett, MD;  Location: [facility];  Service: General;  Laterality: N/A;  lap chole with IOC   History  Substance Use Topics  .  Smoking status: Never Smoker   . Smokeless tobacco: Not on file  . Alcohol Use: No   Family History  Problem Relation Age of Onset  . Hypertension Mother   . Cancer Mother     breast, uterine, and cervical  . Heart disease Father     CAD  . Hypertension Father   . Diabetes Father     TYPE II  . Cancer Father     brain   No Known Allergies Current Outpatient Prescriptions on File Prior to Visit  Medication Sig Dispense Refill  . Calcium Carbonate-Vit D-Min 600-400 MG-UNIT TABS Take 1 tablet by mouth daily.       . DiphenhydrAMINE HCl (ALLERGY MED PO) Take 25 mg by mouth daily as needed. Allergies       . hyoscyamine (LEVSIN SL) 0.125 MG SL tablet Place 1 tablet (0.125 mg total) under the tongue 2 (two) times daily as needed for cramping. Abdominal cramping  30 tablet  3  . lisinopril-hydrochlorothiazide (PRINZIDE,ZESTORETIC) 10-12.5 MG per tablet Take 0.5 tablets by mouth every morning.  90 tablet  0  . metroNIDAZOLE (METROCREAM) 0.75 % cream Apply 1 application topically daily. Rosacea      . Multiple Vitamin (MULTIVITAMIN) tablet Take 1 tablet  by mouth daily.        No current facility-administered medications on file prior to visit.     Review of Systems Review of Systems  Constitutional: Negative for fever, appetite change, fatigue and unexpected weight change.  Eyes: Negative for pain and visual disturbance.  Respiratory: Negative for cough and shortness of breath.   Cardiovascular: Negative for cp or palpitations    Gastrointestinal: Negative for nausea, diarrhea and constipation.  Genitourinary: Negative for urgency and frequency.  Skin: Negative for pallor or rash   Neurological: Negative for weakness, light-headedness, numbness and headaches.  Hematological: Negative for adenopathy. Does not bruise/bleed easily.  Psychiatric/Behavioral: pos for stress reaction / pt denies depression          Objective:   Physical Exam  Constitutional: She appears well-developed  and well-nourished. No distress.  HENT:  Head: Normocephalic and atraumatic.  Mouth/Throat: Oropharynx is clear and moist.  Eyes: Conjunctivae and EOM are normal. Pupils are equal, round, and reactive to light. No scleral icterus.  Neck: Normal range of motion. Neck supple. No JVD present. Carotid bruit is not present. No thyromegaly present.  Cardiovascular: Normal rate, regular rhythm and intact distal pulses.  Exam reveals no gallop.   Pulmonary/Chest: Effort normal and breath sounds normal. No respiratory distress. She has no wheezes. She exhibits no tenderness.  Abdominal: Soft. Bowel sounds are normal. She exhibits no distension, no abdominal bruit and no mass. There is no tenderness.  Musculoskeletal: She exhibits no edema and no tenderness.  Lymphadenopathy:    She has no cervical adenopathy.  Neurological: She is alert. She has normal reflexes. No cranial nerve deficit. She exhibits normal muscle tone. Coordination normal.  Skin: Skin is warm and dry. No rash noted. No erythema. No pallor.  Rosacea noted  Healing surgical site on R side of nose is well healed   Psychiatric:  A bit tearful when she admits to going through stress  Attentive and appropriate however           Assessment & Plan:

## 2013-09-20 NOTE — Progress Notes (Signed)
Pre visit review using our clinic review tool, if applicable. No additional management support is needed unless otherwise documented below in the visit note. 

## 2013-09-20 NOTE — Assessment & Plan Note (Signed)
Pt was unable to do her colonoscopy in the past year Desires another referral

## 2013-09-21 ENCOUNTER — Telehealth: Payer: Self-pay | Admitting: Family Medicine

## 2013-09-21 ENCOUNTER — Encounter: Payer: Self-pay | Admitting: *Deleted

## 2013-09-21 LAB — CBC WITH DIFFERENTIAL/PLATELET
BASOS ABS: 0 10*3/uL (ref 0.0–0.1)
Basophils Relative: 0.4 % (ref 0.0–3.0)
EOS ABS: 0.3 10*3/uL (ref 0.0–0.7)
Eosinophils Relative: 3.8 % (ref 0.0–5.0)
HEMATOCRIT: 40.8 % (ref 36.0–46.0)
HEMOGLOBIN: 13.4 g/dL (ref 12.0–15.0)
LYMPHS ABS: 2.5 10*3/uL (ref 0.7–4.0)
Lymphocytes Relative: 36.9 % (ref 12.0–46.0)
MCHC: 33 g/dL (ref 30.0–36.0)
MCV: 92.4 fl (ref 78.0–100.0)
Monocytes Absolute: 0.6 10*3/uL (ref 0.1–1.0)
Monocytes Relative: 8.4 % (ref 3.0–12.0)
NEUTROS ABS: 3.5 10*3/uL (ref 1.4–7.7)
Neutrophils Relative %: 50.5 % (ref 43.0–77.0)
Platelets: 227 10*3/uL (ref 150.0–400.0)
RBC: 4.42 Mil/uL (ref 3.87–5.11)
RDW: 13.6 % (ref 11.5–14.6)
WBC: 6.9 10*3/uL (ref 4.5–10.5)

## 2013-09-21 LAB — COMPREHENSIVE METABOLIC PANEL
ALT: 26 U/L (ref 0–35)
AST: 32 U/L (ref 0–37)
Albumin: 4.3 g/dL (ref 3.5–5.2)
Alkaline Phosphatase: 53 U/L (ref 39–117)
BILIRUBIN TOTAL: 1 mg/dL (ref 0.3–1.2)
BUN: 14 mg/dL (ref 6–23)
CO2: 27 meq/L (ref 19–32)
CREATININE: 0.8 mg/dL (ref 0.4–1.2)
Calcium: 9.8 mg/dL (ref 8.4–10.5)
Chloride: 101 mEq/L (ref 96–112)
GFR: 72.71 mL/min (ref >60.00)
Glucose, Bld: 73 mg/dL (ref 70–99)
Potassium: 3.9 mEq/L (ref 3.5–5.1)
Sodium: 137 mEq/L (ref 135–145)
Total Protein: 7.3 g/dL (ref 6.0–8.3)

## 2013-09-21 LAB — VITAMIN D 25 HYDROXY (VIT D DEFICIENCY, FRACTURES): Vit D, 25-Hydroxy: 58 ng/mL (ref 30–89)

## 2013-09-21 LAB — TSH: TSH: 0.43 u[IU]/mL (ref 0.35–5.50)

## 2013-09-21 LAB — LIPID PANEL
CHOL/HDL RATIO: 2
CHOLESTEROL: 191 mg/dL (ref 0–200)
HDL: 86.5 mg/dL (ref >39.00)
LDL Cholesterol: 89 mg/dL (ref 0–99)
TRIGLYCERIDES: 78 mg/dL (ref 0.0–149.0)
VLDL: 15.6 mg/dL (ref 0.0–40.0)

## 2013-09-21 NOTE — Telephone Encounter (Signed)
Relevant patient education mailed to patient.  

## 2013-09-22 ENCOUNTER — Encounter: Payer: Self-pay | Admitting: *Deleted

## 2013-10-18 DIAGNOSIS — T8149XA Infection following a procedure, other surgical site, initial encounter: Secondary | ICD-10-CM | POA: Insufficient documentation

## 2013-11-10 ENCOUNTER — Ambulatory Visit (AMBULATORY_SURGERY_CENTER): Payer: Managed Care, Other (non HMO) | Admitting: *Deleted

## 2013-11-10 VITALS — Ht 62.0 in | Wt 129.2 lb

## 2013-11-10 DIAGNOSIS — Z1211 Encounter for screening for malignant neoplasm of colon: Secondary | ICD-10-CM

## 2013-11-10 MED ORDER — MOVIPREP 100 G PO SOLR: ORAL | Status: DC

## 2013-11-10 NOTE — Progress Notes (Signed)
No allergies to eggs or soy. No problems with anesthesia.  Pt given Emmi instructions for colonoscopy  No oxygen use  No diet drug use  

## 2013-11-19 ENCOUNTER — Encounter: Payer: Self-pay | Admitting: Family Medicine

## 2013-11-19 ENCOUNTER — Ambulatory Visit (INDEPENDENT_AMBULATORY_CARE_PROVIDER_SITE_OTHER): Payer: Managed Care, Other (non HMO) | Admitting: Family Medicine

## 2013-11-19 VITALS — BP 136/70 | HR 90 | Temp 99.6°F | Ht 62.75 in | Wt 127.2 lb

## 2013-11-19 DIAGNOSIS — K5289 Other specified noninfective gastroenteritis and colitis: Secondary | ICD-10-CM

## 2013-11-19 DIAGNOSIS — K529 Noninfective gastroenteritis and colitis, unspecified: Secondary | ICD-10-CM | POA: Insufficient documentation

## 2013-11-19 DIAGNOSIS — R197 Diarrhea, unspecified: Secondary | ICD-10-CM | POA: Insufficient documentation

## 2013-11-19 DIAGNOSIS — R112 Nausea with vomiting, unspecified: Secondary | ICD-10-CM

## 2013-11-19 MED ORDER — PROMETHAZINE HCL 25 MG PO TABS: 25.0000 mg | ORAL_TABLET | Freq: Three times a day (TID) | ORAL | Status: DC | PRN

## 2013-11-19 MED ORDER — PROMETHAZINE HCL 25 MG/ML IJ SOLN
50.0000 mg | Freq: Once | INTRAMUSCULAR | Status: AC
  Administered 2013-11-19: 50 mg via INTRAMUSCULAR

## 2013-11-19 NOTE — Patient Instructions (Signed)
Phenergan injection now for nausea  Take oral phenergan up to every 8 hours as needed for nausea Sip fluids  BRAT diet (banana/rice/applesauce/toast) advance it very slowly We will send a stool sample for c diff If not improved by Monday please let me know

## 2013-11-19 NOTE — Progress Notes (Signed)
Pre visit review using our clinic review tool, if applicable. No additional management support is needed unless otherwise documented below in the visit note. 

## 2013-11-19 NOTE — Progress Notes (Signed)
Subjective:    Patient ID: Marie Kennedy, female    DOB: 1945/08/14, 68 y.o.   MRN: [id number]  HPI Here for n/v/d  Started on Monday  Out of work since then   Has had n/v/d and a fever  Now she is still nauseated -vomited once in the middle of the night  Ate a bite of bread this am  Diarrhea -still loose every few hours  No blood in stool or in stomach secretions  Throat and chest are irritated and sore from vomiting   No med for fever  Low grade fever today  Has had chills and aches   Some stomach pain - all over -no one spot  Worse before she vomits or has diarrhea then improves (like cramping)   No sick contacts Last thing she ate was a salad from [employer]   She was recently on cipro  Patient Active Problem List   Diagnosis Date Noted  . Colon cancer screening 06/22/2012  . Hyperbilirubinemia 06/22/2012  . Gastritis 01/29/2012  . Osteopenia 12/12/2010  . Post-menopausal 12/12/2010  . Other screening mammogram 12/04/2010  . ALLERGIC RHINITIS, SEASONAL 11/13/2009  . DEPRESSION 05/04/2007  . HYPERTENSION 05/04/2007  . IBS 05/04/2007  . ROSACEA 05/04/2007   Past Medical History  Diagnosis Date  . Rosacea   . IBS (irritable bowel syndrome)   . Fibroids 1993    cysts endometriosis  . GERD (gastroesophageal reflux disease)   . Hypertension     PCP   Dr Glori Bickers at [facility]  . Allergy   . Basal cell adenoma 11/14    face   Past Surgical History  Procedure Laterality Date  . Abdominal hysterectomy  1993  . Breast surgery Right 1970's    lumpectomy no cancer  . Tonsillectomy  1953  . Cholecystectomy  06/18/2011    Procedure: LAPAROSCOPIC CHOLECYSTECTOMY WITH INTRAOPERATIVE CHOLANGIOGRAM;  Surgeon: Joyice Faster. Cornett, MD;  Location: [facility];  Service: General;  Laterality: N/A;  lap chole with IOC  . Basal cell carcinoma excision  11/14    face   History  Substance Use Topics  . Smoking status: Never Smoker   . Smokeless tobacco: Never Used  .  Alcohol Use: No   Family History  Problem Relation Age of Onset  . Hypertension Mother   . Cancer Mother     breast, uterine, and cervical  . Heart disease Father     CAD  . Hypertension Father   . Diabetes Father     TYPE II  . Cancer Father     brain  . Colon cancer Neg Hx    No Known Allergies Current Outpatient Prescriptions on File Prior to Visit  Medication Sig Dispense Refill  . Calcium Carbonate-Vit D-Min 600-400 MG-UNIT TABS Take 1 tablet by mouth daily.       . ciprofloxacin (CIPRO) 500 MG tablet Take 500 mg by mouth 2 (two) times daily.      . DiphenhydrAMINE HCl (ALLERGY MED PO) Take 25 mg by mouth daily as needed. Allergies       . hyoscyamine (LEVSIN SL) 0.125 MG SL tablet Place 1 tablet (0.125 mg total) under the tongue 2 (two) times daily as needed for cramping. Abdominal cramping  30 tablet  3  . lisinopril-hydrochlorothiazide (PRINZIDE,ZESTORETIC) 10-12.5 MG per tablet Take 0.5 tablets by mouth every morning.  90 tablet  3  . metroNIDAZOLE (METROCREAM) 0.75 % cream Apply 1 application topically daily. Rosacea      .  MOVIPREP 100 G SOLR moviprep as directed. No substitutions  1 kit  0  . Multiple Vitamin (MULTIVITAMIN) tablet Take 1 tablet by mouth daily.        No current facility-administered medications on file prior to visit.    Review of Systems Review of Systems  Constitutional: Negative for fever, appetite change, fatigue and unexpected weight change.  Eyes: Negative for pain and visual disturbance.  Respiratory: Negative for cough and shortness of breath.   Cardiovascular: Negative for cp or palpitations    Gastrointestinal: Negative for constipation. pos for abd cramping/diarrhea / neg for blood in stool Genitourinary: Negative for urgency and frequency.  Skin: Negative for pallor or rash   Neurological: Negative for weakness, light-headedness, numbness and headaches.  Hematological: Negative for adenopathy. Does not bruise/bleed easily.    Psychiatric/Behavioral: Negative for dysphoric mood. The patient is not nervous/anxious.         Objective:   Physical Exam  Constitutional: She appears well-developed and well-nourished. No distress.  HENT:  Head: Normocephalic and atraumatic.  Eyes: Conjunctivae and EOM are normal. Pupils are equal, round, and reactive to light. No scleral icterus.  Neck: Normal range of motion. Neck supple. No thyromegaly present.  Cardiovascular: Normal rate, regular rhythm and normal heart sounds.  Exam reveals no gallop.   Pulmonary/Chest: Effort normal and breath sounds normal. No respiratory distress. She has no wheezes. She has no rales.  Abdominal: Soft. She exhibits no distension and no mass. There is tenderness. There is no rebound and no guarding.  Mild tenderness in bilat LQ without rebound or guarding   bs are mildly hyperactive but not high pitched  Musculoskeletal: She exhibits no edema.  Lymphadenopathy:    She has no cervical adenopathy.  Neurological: She is alert. She has normal reflexes. No cranial nerve deficit. She exhibits normal muscle tone. Coordination normal.  Skin: Skin is warm and dry. No rash noted. No pallor.  Psychiatric: She has a normal mood and affect.          Assessment & Plan:

## 2013-11-21 NOTE — Assessment & Plan Note (Signed)
In light of recent diarrhea will also check c diff test

## 2013-11-21 NOTE — Assessment & Plan Note (Signed)
Suspect viral  Disc symptomatic care - see instructions on AVS  Phenergan inj today 50 mg and then 25 mg oral up to tid -rev poss side eff Sips of fluids (no s/s of dehydration now) [employer]- adv as tolerated If no imp by Monday will get labs If worse-inst to seek care in ER

## 2013-11-22 ENCOUNTER — Other Ambulatory Visit: Payer: Self-pay | Admitting: Family Medicine

## 2013-11-22 NOTE — Addendum Note (Signed)
Addended by: Ellamae Sia on: 11/22/2013 08:43 AM   Modules accepted: Orders

## 2013-11-23 ENCOUNTER — Encounter: Payer: Self-pay | Admitting: Internal Medicine

## 2013-11-23 LAB — C. DIFFICILE GDH AND TOXIN A/B
C. difficile GDH: NOT DETECTED
C. difficile Toxin A/B: NOT DETECTED

## 2013-11-24 ENCOUNTER — Encounter: Payer: Managed Care, Other (non HMO) | Admitting: Internal Medicine

## 2013-12-01 ENCOUNTER — Ambulatory Visit: Payer: Self-pay | Admitting: Family Medicine

## 2013-12-02 ENCOUNTER — Encounter: Payer: Self-pay | Admitting: Family Medicine

## 2013-12-03 ENCOUNTER — Ambulatory Visit (AMBULATORY_SURGERY_CENTER): Payer: Managed Care, Other (non HMO) | Admitting: Internal Medicine

## 2013-12-03 ENCOUNTER — Encounter: Payer: Self-pay | Admitting: *Deleted

## 2013-12-03 ENCOUNTER — Encounter: Payer: Self-pay | Admitting: Internal Medicine

## 2013-12-03 VITALS — BP 121/72 | HR 71 | Temp 97.2°F | Resp 13 | Ht 62.0 in | Wt 129.0 lb

## 2013-12-03 DIAGNOSIS — Z1211 Encounter for screening for malignant neoplasm of colon: Secondary | ICD-10-CM

## 2013-12-03 DIAGNOSIS — D126 Benign neoplasm of colon, unspecified: Secondary | ICD-10-CM

## 2013-12-03 MED ORDER — SODIUM CHLORIDE 0.9 % IV SOLN: 500.0000 mL | INTRAVENOUS | Status: DC

## 2013-12-03 NOTE — Op Note (Signed)
[facility]  [address] [place], [address]   COLONOSCOPY PROCEDURE REPORT  PATIENT: Marie Kennedy, Marie Kennedy  MR#: [id number] BIRTHDATE: July 19, 1945 , 54  yrs. old GENDER: Female ENDOSCOPIST: Lafayette Dragon, MD REFERRED XT:GGYIR Vernell Morgans, M.D. PROCEDURE DATE:  12/03/2013 PROCEDURE:   Colonoscopy with cold biopsy polypectomy First Screening Colonoscopy - Avg.  risk and is 50 yrs.  old or older - No.  Prior Negative Screening - Now for repeat screening. 10 or more years since last screening  History of Adenoma - Now for follow-up colonoscopy & has been > or = to 3 yrs.  N/A  Polyps Removed Today? Yes. ASA CLASS:   Class II INDICATIONS:Average risk patient for colon cancer and prior colonoscopy about 15 [place] ago in and [place] of state.  Report not available. MEDICATIONS: MAC sedation, administered by CRNA and Propofol (Diprivan) 280 mg IV  DESCRIPTION OF PROCEDURE:   After the risks benefits and alternatives of the procedure were thoroughly explained, informed consent was obtained.  A digital rectal exam revealed no abnormalities of the rectum.   The LB [id number]  endoscope was introduced through the anus and advanced to the cecum, which was identified by both the appendix and ileocecal valve. No adverse events experienced.   The quality of the prep was good, using MoviPrep  The instrument was then slowly withdrawn as the colon was fully examined.      COLON FINDINGS: A sessile polyp ranging between 3-76mm in size was found in the ascending colon.  A polypectomy was performed with cold forceps.  The resection was complete and the polyp tissue was completely retrieved.  Retroflexed views revealed no abnormalities. The time to cecum=8 minutes 46 seconds.  Withdrawal time=6 minutes 57 seconds.  The scope was withdrawn and the procedure completed. COMPLICATIONS: There were no complications.  ENDOSCOPIC IMPRESSION: Sessile polyp ranging between 3-1mm in size was found  in the ascending colon; polypectomy was performed with cold forceps  RECOMMENDATIONS: 1.  Await pathology results 2.  high-fiber diet Recall colonoscopy pending path report   eSigned:  Lafayette Dragon, MD 12/03/2013 9:28 AM   cc:   PATIENT NAME:  Marie Kennedy, Marie Kennedy MR#: [id number]

## 2013-12-03 NOTE — Progress Notes (Signed)
Procedure ends, to recovery, report given and VSS. 

## 2013-12-03 NOTE — Patient Instructions (Signed)
YOU HAD AN ENDOSCOPIC PROCEDURE TODAY AT THE [facility]: Refer to the procedure report that was given to you for any specific questions about what was found during the examination.  If the procedure report does not answer your questions, please call your gastroenterologist to clarify.  If you requested that your care partner not be given the details of your procedure findings, then the procedure report has been included in a sealed envelope for you to review at your convenience later.  YOU SHOULD EXPECT: Some feelings of bloating in the abdomen. Passage of more gas than usual.  Walking can help get rid of the air that was put into your GI tract during the procedure and reduce the bloating. If you had a lower endoscopy (such as a colonoscopy or flexible sigmoidoscopy) you may notice spotting of blood in your stool or on the toilet paper. If you underwent a bowel prep for your procedure, then you may not have a normal bowel movement for a few days.  DIET: Your first meal following the procedure should be a light meal and then it is ok to progress to your normal diet.  A half-sandwich or bowl of soup is an example of a good first meal.  Heavy or fried foods are harder to digest and may make you feel nauseous or bloated.  Likewise meals heavy in dairy and vegetables can cause extra gas to form and this can also increase the bloating.  Drink plenty of fluids but you should avoid alcoholic beverages for 24 hours.  ACTIVITY: Your care partner should take you home directly after the procedure.  You should plan to take it easy, moving slowly for the rest of the day.  You can resume normal activity the day after the procedure however you should NOT DRIVE or use heavy machinery for 24 hours (because of the sedation medicines used during the test).    SYMPTOMS TO REPORT IMMEDIATELY: A gastroenterologist can be reached at any hour.  During normal business hours, 8:30 AM to 5:00 PM Monday through Friday,  call [phone].  After hours and on weekends, please call the GI answering service at [phone] who will take a message and have the physician on call contact you.   Following lower endoscopy (colonoscopy or flexible sigmoidoscopy):  Excessive amounts of blood in the stool  Significant tenderness or worsening of abdominal pains  Swelling of the abdomen that is new, acute  Fever of 100F or higher    FOLLOW UP: If any biopsies were taken you will be contacted by phone or by letter within the next 1-3 weeks.  Call your gastroenterologist if you have not heard about the biopsies in 3 weeks.  Our staff will call the home number listed on your records the next business day following your procedure to check on you and address any questions or concerns that you may have at that time regarding the information given to you following your procedure. This is a courtesy call and so if there is no answer at the home number and we have not heard from you through the emergency physician on call, we will assume that you have returned to your regular daily activities without incident.  SIGNATURES/CONFIDENTIALITY: You and/or your care partner have signed paperwork which will be entered into your electronic medical record.  These signatures attest to the fact that that the information above on your After Visit Summary has been reviewed and is understood.  Full responsibility of the confidentiality   of this discharge information lies with you and/or your care-partn   INFORMATION ON POLYPS AND HIGH FIBER DIET GIVEN TO YOU TODAY

## 2013-12-03 NOTE — Progress Notes (Signed)
Called to room to assist during endoscopic procedure.  Patient ID and intended procedure confirmed with present staff. Received instructions for my participation in the procedure from the performing physician.  

## 2013-12-06 ENCOUNTER — Telehealth: Payer: Self-pay | Admitting: *Deleted

## 2013-12-06 NOTE — Telephone Encounter (Signed)
  Follow up Call-  Call back number 12/03/2013  Post procedure Call Back phone  # [phone]  Permission to leave phone message Yes     No answer, left message.

## 2013-12-07 ENCOUNTER — Encounter: Payer: Self-pay | Admitting: Internal Medicine

## 2014-04-22 ENCOUNTER — Ambulatory Visit (INDEPENDENT_AMBULATORY_CARE_PROVIDER_SITE_OTHER): Payer: Managed Care, Other (non HMO)

## 2014-04-22 DIAGNOSIS — Z23 Encounter for immunization: Secondary | ICD-10-CM

## 2014-11-16 ENCOUNTER — Ambulatory Visit (INDEPENDENT_AMBULATORY_CARE_PROVIDER_SITE_OTHER): Payer: PPO | Admitting: Family Medicine

## 2014-11-16 ENCOUNTER — Encounter: Payer: Self-pay | Admitting: Family Medicine

## 2014-11-16 VITALS — BP 122/74 | HR 73 | Temp 98.3°F | Ht 62.75 in | Wt 126.5 lb

## 2014-11-16 DIAGNOSIS — Z23 Encounter for immunization: Secondary | ICD-10-CM

## 2014-11-16 DIAGNOSIS — I10 Essential (primary) hypertension: Secondary | ICD-10-CM | POA: Diagnosis not present

## 2014-11-16 DIAGNOSIS — K589 Irritable bowel syndrome without diarrhea: Secondary | ICD-10-CM

## 2014-11-16 LAB — LIPID PANEL
CHOLESTEROL: 178 mg/dL (ref 0–200)
HDL: 78.1 mg/dL (ref >39.00)
LDL Cholesterol: 89 mg/dL (ref 0–99)
NonHDL: 99.9
TRIGLYCERIDES: 57 mg/dL (ref 0.0–149.0)
Total CHOL/HDL Ratio: 2
VLDL: 11.4 mg/dL (ref 0.0–40.0)

## 2014-11-16 LAB — CBC WITH DIFFERENTIAL/PLATELET
Basophils Absolute: 0 10*3/uL (ref 0.0–0.1)
Basophils Relative: 0.8 % (ref 0.0–3.0)
Eosinophils Absolute: 0.2 10*3/uL (ref 0.0–0.7)
Eosinophils Relative: 4 % (ref 0.0–5.0)
HCT: 40.9 % (ref 36.0–46.0)
Hemoglobin: 13.6 g/dL (ref 12.0–15.0)
LYMPHS ABS: 1.8 10*3/uL (ref 0.7–4.0)
Lymphocytes Relative: 37.2 % (ref 12.0–46.0)
MCHC: 33.2 g/dL (ref 30.0–36.0)
MCV: 90.7 fl (ref 78.0–100.0)
Monocytes Absolute: 0.5 10*3/uL (ref 0.1–1.0)
Monocytes Relative: 10.9 % (ref 3.0–12.0)
NEUTROS ABS: 2.3 10*3/uL (ref 1.4–7.7)
Neutrophils Relative %: 47.1 % (ref 43.0–77.0)
Platelets: 208 10*3/uL (ref 150.0–400.0)
RBC: 4.51 Mil/uL (ref 3.87–5.11)
RDW: 13 % (ref 11.5–15.5)
WBC: 4.9 10*3/uL (ref 4.0–10.5)

## 2014-11-16 LAB — COMPREHENSIVE METABOLIC PANEL
ALT: 20 U/L (ref 0–35)
AST: 23 U/L (ref 0–37)
Albumin: 4.1 g/dL (ref 3.5–5.2)
Alkaline Phosphatase: 65 U/L (ref 39–117)
BUN: 17 mg/dL (ref 6–23)
CHLORIDE: 100 meq/L (ref 96–112)
CO2: 31 mEq/L (ref 19–32)
CREATININE: 0.9 mg/dL (ref 0.40–1.20)
Calcium: 10.1 mg/dL (ref 8.4–10.5)
GFR: 65.99 mL/min (ref >60.00)
Glucose, Bld: 87 mg/dL (ref 70–99)
Potassium: 4.4 mEq/L (ref 3.5–5.1)
Sodium: 135 mEq/L (ref 135–145)
Total Bilirubin: 1.3 mg/dL — ABNORMAL HIGH (ref 0.2–1.2)
Total Protein: 7.1 g/dL (ref 6.0–8.3)

## 2014-11-16 LAB — TSH: TSH: 1.53 u[IU]/mL (ref 0.35–4.50)

## 2014-11-16 MED ORDER — HYOSCYAMINE SULFATE 0.125 MG SL SUBL: 0.1250 mg | SUBLINGUAL_TABLET | Freq: Two times a day (BID) | SUBLINGUAL | Status: DC | PRN

## 2014-11-16 MED ORDER — LISINOPRIL-HYDROCHLOROTHIAZIDE 10-12.5 MG PO TABS: 0.5000 | ORAL_TABLET | ORAL | Status: DC

## 2014-11-16 NOTE — Assessment & Plan Note (Signed)
bp in fair control at this time  BP Readings from Last 1 Encounters:  11/16/14 122/74   No changes needed Disc lifstyle change with low sodium diet and exercise  Labs today  No change in med-on lisinopril hct

## 2014-11-16 NOTE — Assessment & Plan Note (Signed)
Refilled levsin Less symptoms since retirement (less stress)  Disc imp of fiber in diet

## 2014-11-16 NOTE — Patient Instructions (Signed)
Take care of yourself  Continue current medicines prevnar vaccine today  Labs today

## 2014-11-16 NOTE — Progress Notes (Signed)
Subjective:    Patient ID: Marie Kennedy, female    DOB: 09-Jan-1946, 69 y.o.   MRN: [id number]  HPI Here for f/u of chronic medical problems   Doing well   bp is stable today  No cp or palpitations or headaches or edema  No side effects to medicines  BP Readings from Last 3 Encounters:  11/16/14 122/74  12/03/13 121/72  11/19/13 136/70     Wt is down 3 lb  bmi is 22  Walking more than she is not working - taking care of herself  Eats healthy much of the time - a lot of salads and chicken   Wants to get prevnar while she is here- is due   Has levsin for IBS-needs it infrequently  Symptoms are better since she retired (much less stress)    Lab Results  Component Value Date   CHOL 191 09/20/2013   HDL 86.50 09/20/2013   LDLCALC 89 09/20/2013   TRIG 78.0 09/20/2013   CHOLHDL 2 09/20/2013     Patient Active Problem List   Diagnosis Date Noted  . Gastroenteritis 11/19/2013  . Diarrhea 11/19/2013  . Colon cancer screening 06/22/2012  . Hyperbilirubinemia 06/22/2012  . Gastritis 01/29/2012  . Osteopenia 12/12/2010  . Post-menopausal 12/12/2010  . Other screening mammogram 12/04/2010  . ALLERGIC RHINITIS, SEASONAL 11/13/2009  . DEPRESSION 05/04/2007  . Essential hypertension 05/04/2007  . IBS 05/04/2007  . ROSACEA 05/04/2007   Past Medical History  Diagnosis Date  . Rosacea   . IBS (irritable bowel syndrome)   . Fibroids 1993    cysts endometriosis  . GERD (gastroesophageal reflux disease)   . Hypertension     PCP   Dr Glori Bickers at [facility]  . Allergy   . Basal cell adenoma 11/14    face   Past Surgical History  Procedure Laterality Date  . Abdominal hysterectomy  1993  . Breast surgery Right 1970's    lumpectomy no cancer  . Tonsillectomy  1953  . Cholecystectomy  06/18/2011    Procedure: LAPAROSCOPIC CHOLECYSTECTOMY WITH INTRAOPERATIVE CHOLANGIOGRAM;  Surgeon: Joyice Faster. Cornett, MD;  Location: [facility];  Service: General;  Laterality: N/A;   lap chole with IOC  . Basal cell carcinoma excision  11/14    face   History  Substance Use Topics  . Smoking status: Never Smoker   . Smokeless tobacco: Never Used  . Alcohol Use: No   Family History  Problem Relation Age of Onset  . Hypertension Mother   . Cancer Mother     breast, uterine, and cervical  . Heart disease Father     CAD  . Hypertension Father   . Diabetes Father     TYPE II  . Cancer Father     brain  . Colon cancer Neg Hx    No Known Allergies Current Outpatient Prescriptions on File Prior to Visit  Medication Sig Dispense Refill  . Calcium Carbonate-Vit D-Min 600-400 MG-UNIT TABS Take 1 tablet by mouth daily.     . DiphenhydrAMINE HCl (ALLERGY MED PO) Take 25 mg by mouth daily as needed. Allergies     . metroNIDAZOLE (METROCREAM) 0.75 % cream Apply 1 application topically daily. Rosacea    . Multiple Vitamin (MULTIVITAMIN) tablet Take 1 tablet by mouth daily.      No current facility-administered medications on file prior to visit.    Review of Systems Review of Systems  Constitutional: Negative for fever, appetite change, fatigue and unexpected  weight change.  Eyes: Negative for pain and visual disturbance.  Respiratory: Negative for cough and shortness of breath.   Cardiovascular: Negative for cp or palpitations    Gastrointestinal: Negative for nausea, diarrhea and constipation.  Genitourinary: Negative for urgency and frequency.  Skin: Negative for pallor or rash   Neurological: Negative for weakness, light-headedness, numbness and headaches.  Hematological: Negative for adenopathy. Does not bruise/bleed easily.  Psychiatric/Behavioral: Negative for dysphoric mood. The patient is not nervous/anxious.         Objective:   Physical Exam  Constitutional: She appears well-developed and well-nourished. No distress.  HENT:  Head: Normocephalic and atraumatic.  Mouth/Throat: Oropharynx is clear and moist.  Eyes: Conjunctivae and EOM are normal.  Pupils are equal, round, and reactive to light.  Neck: Normal range of motion. Neck supple. No JVD present. Carotid bruit is not present. No thyromegaly present.  Cardiovascular: Normal rate, regular rhythm, normal heart sounds and intact distal pulses.  Exam reveals no gallop.   Pulmonary/Chest: Effort normal and breath sounds normal. No respiratory distress. She has no wheezes. She has no rales.  No crackles  Abdominal: Soft. Bowel sounds are normal. She exhibits no distension, no abdominal bruit and no mass. There is no tenderness.  Musculoskeletal: She exhibits no edema.  Lymphadenopathy:    She has no cervical adenopathy.  Neurological: She is alert. She has normal reflexes.  Skin: Skin is warm and dry. No rash noted.  Psychiatric: She has a normal mood and affect.          Assessment & Plan:   Problem List Items Addressed This Visit    Essential hypertension - Primary    bp in fair control at this time  BP Readings from Last 1 Encounters:  11/16/14 122/74   No changes needed Disc lifstyle change with low sodium diet and exercise  Labs today  No change in med-on lisinopril hct       Relevant Medications   lisinopril-hydrochlorothiazide (PRINZIDE,ZESTORETIC) 10-12.5 MG per tablet   Other Relevant Orders   CBC with Differential/Platelet (Completed)   Comprehensive metabolic panel (Completed)   TSH (Completed)   Lipid panel (Completed)   IBS    Refilled levsin Less symptoms since retirement (less stress)  Disc imp of fiber in diet       Relevant Medications   hyoscyamine (LEVSIN SL) 0.125 MG SL tablet    Other Visit Diagnoses    Need for vaccination with 13-polyvalent pneumococcal conjugate vaccine        Relevant Orders    Pneumococcal conjugate vaccine 13-valent (Completed)

## 2014-11-16 NOTE — Progress Notes (Signed)
Pre visit review using our clinic review tool, if applicable. No additional management support is needed unless otherwise documented below in the visit note. 

## 2014-11-17 ENCOUNTER — Encounter: Payer: Self-pay | Admitting: *Deleted

## 2014-11-21 ENCOUNTER — Other Ambulatory Visit: Payer: Self-pay | Admitting: Family Medicine

## 2014-11-21 DIAGNOSIS — Z1231 Encounter for screening mammogram for malignant neoplasm of breast: Secondary | ICD-10-CM

## 2014-12-01 ENCOUNTER — Ambulatory Visit
Admission: RE | Admit: 2014-12-01 | Discharge: 2014-12-01 | Disposition: A | Discharge status: Home / Self Care | Payer: PPO | Source: Ambulatory Visit | Attending: Family Medicine | Admitting: Family Medicine

## 2014-12-01 DIAGNOSIS — Z1231 Encounter for screening mammogram for malignant neoplasm of breast: Secondary | ICD-10-CM | POA: Insufficient documentation

## 2014-12-01 LAB — HM MAMMOGRAPHY: HM MAMMO: NORMAL

## 2014-12-02 ENCOUNTER — Encounter: Payer: Self-pay | Admitting: Family Medicine

## 2014-12-02 ENCOUNTER — Encounter: Payer: Self-pay | Admitting: *Deleted

## 2015-05-23 ENCOUNTER — Ambulatory Visit (INDEPENDENT_AMBULATORY_CARE_PROVIDER_SITE_OTHER): Payer: PPO

## 2015-05-23 DIAGNOSIS — Z23 Encounter for immunization: Secondary | ICD-10-CM | POA: Diagnosis not present

## 2015-11-22 ENCOUNTER — Other Ambulatory Visit: Payer: Self-pay | Admitting: Family Medicine

## 2015-11-22 ENCOUNTER — Encounter: Payer: Self-pay | Admitting: Family Medicine

## 2015-11-22 ENCOUNTER — Ambulatory Visit (INDEPENDENT_AMBULATORY_CARE_PROVIDER_SITE_OTHER): Payer: PPO | Admitting: Family Medicine

## 2015-11-22 VITALS — BP 108/58 | HR 63 | Temp 98.3°F | Ht 62.75 in | Wt 120.2 lb

## 2015-11-22 DIAGNOSIS — I10 Essential (primary) hypertension: Secondary | ICD-10-CM | POA: Diagnosis not present

## 2015-11-22 DIAGNOSIS — Z209 Contact with and (suspected) exposure to unspecified communicable disease: Secondary | ICD-10-CM | POA: Insufficient documentation

## 2015-11-22 DIAGNOSIS — Z0000 Encounter for general adult medical examination without abnormal findings: Secondary | ICD-10-CM

## 2015-11-22 DIAGNOSIS — E2839 Other primary ovarian failure: Secondary | ICD-10-CM | POA: Diagnosis not present

## 2015-11-22 DIAGNOSIS — M8580 Other specified disorders of bone density and structure, unspecified site: Secondary | ICD-10-CM

## 2015-11-22 DIAGNOSIS — Z1231 Encounter for screening mammogram for malignant neoplasm of breast: Secondary | ICD-10-CM

## 2015-11-22 DIAGNOSIS — K589 Irritable bowel syndrome without diarrhea: Secondary | ICD-10-CM | POA: Diagnosis not present

## 2015-11-22 LAB — COMPREHENSIVE METABOLIC PANEL
ALK PHOS: 81 U/L (ref 39–117)
ALT: 24 U/L (ref 0–35)
AST: 22 U/L (ref 0–37)
Albumin: 4.4 g/dL (ref 3.5–5.2)
BILIRUBIN TOTAL: 1.3 mg/dL — AB (ref 0.2–1.2)
BUN: 15 mg/dL (ref 6–23)
CALCIUM: 9.8 mg/dL (ref 8.4–10.5)
CO2: 29 mEq/L (ref 19–32)
Chloride: 99 mEq/L (ref 96–112)
Creatinine, Ser: 0.83 mg/dL (ref 0.40–1.20)
GFR: 72.24 mL/min (ref >60.00)
Glucose, Bld: 83 mg/dL (ref 70–99)
Potassium: 3.6 mEq/L (ref 3.5–5.1)
SODIUM: 134 meq/L — AB (ref 135–145)
TOTAL PROTEIN: 7 g/dL (ref 6.0–8.3)

## 2015-11-22 LAB — CBC WITH DIFFERENTIAL/PLATELET
BASOS PCT: 0.8 % (ref 0.0–3.0)
Basophils Absolute: 0.1 10*3/uL (ref 0.0–0.1)
EOS ABS: 0.2 10*3/uL (ref 0.0–0.7)
EOS PCT: 3.2 % (ref 0.0–5.0)
HEMATOCRIT: 39.5 % (ref 36.0–46.0)
HEMOGLOBIN: 13.2 g/dL (ref 12.0–15.0)
LYMPHS PCT: 37.6 % (ref 12.0–46.0)
Lymphs Abs: 2.5 10*3/uL (ref 0.7–4.0)
MCHC: 33.4 g/dL (ref 30.0–36.0)
MCV: 90.1 fl (ref 78.0–100.0)
Monocytes Absolute: 0.6 10*3/uL (ref 0.1–1.0)
Monocytes Relative: 8.3 % (ref 3.0–12.0)
Neutro Abs: 3.3 10*3/uL (ref 1.4–7.7)
Neutrophils Relative %: 50.1 % (ref 43.0–77.0)
Platelets: 241 10*3/uL (ref 150.0–400.0)
RBC: 4.39 Mil/uL (ref 3.87–5.11)
RDW: 13.3 % (ref 11.5–15.5)
WBC: 6.7 10*3/uL (ref 4.0–10.5)

## 2015-11-22 LAB — LIPID PANEL
CHOLESTEROL: 187 mg/dL (ref 0–200)
HDL: 76.7 mg/dL (ref >39.00)
LDL Cholesterol: 96 mg/dL (ref 0–99)
NonHDL: 110.72
Total CHOL/HDL Ratio: 2
Triglycerides: 75 mg/dL (ref 0.0–149.0)
VLDL: 15 mg/dL (ref 0.0–40.0)

## 2015-11-22 LAB — TSH: TSH: 1.97 u[IU]/mL (ref 0.35–4.50)

## 2015-11-22 MED ORDER — LISINOPRIL-HYDROCHLOROTHIAZIDE 10-12.5 MG PO TABS: 0.5000 | ORAL_TABLET | ORAL | Status: DC

## 2015-11-22 NOTE — Progress Notes (Signed)
Subjective:    Patient ID: Marie Kennedy, female    DOB: [birth year], 70 y.o.   MRN: [id number]  HPI Here for f/u of chronic health problems   Feeling fine  Doing well for the most part    Wt is down 6 lb with bmi of 21 Has been walking every day now since retirement  Diet is pretty good overall (husband is trying to loose)-not keeping the high calorie foods in the house    bp is stable today  No cp or palpitations or headaches or edema  No side effects to medicines  BP Readings from Last 3 Encounters:  11/22/15 108/58  11/16/14 122/74  12/03/13 121/72    Gets a little dizzy if out in the sun too long or bends over too long  Tries to drink enough fluids  No falls at all    Last labs a year ago   On lisinopril hct   IBS is not that bad - better since she quit work (stress played a role)   Really enjoying retirement   She has PE appt in Oct   Osteopenia on dexa 2012- due for another  No fractures  Takes ca and D every day  Now exercising   Needs to set up her mammogram - due late May No breast lumps on self exam  Colonoscopy 5/15- with recall 5 years   Patient Active Problem List   Diagnosis Date Noted  . Gastroenteritis 11/19/2013  . Diarrhea 11/19/2013  . Colon cancer screening 06/22/2012  . Hyperbilirubinemia 06/22/2012  . Gastritis 01/29/2012  . Osteopenia 12/12/2010  . Post-menopausal 12/12/2010  . Other screening mammogram 12/04/2010  . ALLERGIC RHINITIS, SEASONAL 11/13/2009  . DEPRESSION 05/04/2007  . Essential hypertension 05/04/2007  . IBS 05/04/2007  . ROSACEA 05/04/2007   Past Medical History  Diagnosis Date  . Rosacea   . IBS (irritable bowel syndrome)   . Fibroids 1993    cysts endometriosis  . GERD (gastroesophageal reflux disease)   . Hypertension     PCP   Dr Glori Bickers at [facility]  . Allergy   . Basal cell adenoma 11/14    face   Past Surgical History  Procedure Laterality Date  . Abdominal hysterectomy  1993  .  Breast surgery Right 1970's    lumpectomy no cancer  . Tonsillectomy  1953  . Cholecystectomy  06/18/2011    Procedure: LAPAROSCOPIC CHOLECYSTECTOMY WITH INTRAOPERATIVE CHOLANGIOGRAM;  Surgeon: Joyice Faster. Cornett, MD;  Location: [facility];  Service: General;  Laterality: N/A;  lap chole with IOC  . Basal cell carcinoma excision  11/14    face  . Breast biopsy Right 1970    Negative   Social History  Substance Use Topics  . Smoking status: Never Smoker   . Smokeless tobacco: Never Used  . Alcohol Use: No   Family History  Problem Relation Age of Onset  . Hypertension Mother   . Cancer Mother     breast, uterine, and cervical  . Heart disease Father     CAD  . Hypertension Father   . Diabetes Father     TYPE II  . Cancer Father     brain  . Colon cancer Neg Hx    No Known Allergies Current Outpatient Prescriptions on File Prior to Visit  Medication Sig Dispense Refill  . Calcium Carbonate-Vit D-Min 600-400 MG-UNIT TABS Take 1 tablet by mouth daily.     . DiphenhydrAMINE HCl (ALLERGY MED PO)  Take 25 mg by mouth daily as needed. Allergies     . hyoscyamine (LEVSIN SL) 0.125 MG SL tablet Place 1 tablet (0.125 mg total) under the tongue 2 (two) times daily as needed for cramping. Abdominal cramping 30 tablet 11  . lisinopril-hydrochlorothiazide (PRINZIDE,ZESTORETIC) 10-12.5 MG per tablet Take 0.5 tablets by mouth every morning. 30 tablet 11  . metroNIDAZOLE (METROCREAM) 0.75 % cream Apply 1 application topically daily. Rosacea    . Multiple Vitamin (MULTIVITAMIN) tablet Take 1 tablet by mouth daily.      No current facility-administered medications on file prior to visit.    Review of Systems Review of Systems  Constitutional: Negative for fever, appetite change, fatigue and unexpected weight change.  Eyes: Negative for pain and visual disturbance.  Respiratory: Negative for cough and shortness of breath.   Cardiovascular: Negative for cp or palpitations    Gastrointestinal:  Negative for nausea, diarrhea and constipation.  Genitourinary: Negative for urgency and frequency.  Skin: Negative for pallor or rash   Neurological: Negative for weakness, light-headedness, numbness and headaches.  Hematological: Negative for adenopathy. Does not bruise/bleed easily.  Psychiatric/Behavioral: Negative for dysphoric mood. The patient is not nervous/anxious.         Objective:   Physical Exam  Constitutional: She appears well-developed and well-nourished. No distress.  Well appearing  HENT:  Head: Normocephalic and atraumatic.  Right Ear: External ear normal.  Left Ear: External ear normal.  Mouth/Throat: Oropharynx is clear and moist.  Eyes: Conjunctivae and EOM are normal. Pupils are equal, round, and reactive to light. No scleral icterus.  Neck: Normal range of motion. Neck supple. No JVD present. Carotid bruit is not present. No thyromegaly present.  Cardiovascular: Normal rate, regular rhythm, normal heart sounds and intact distal pulses.  Exam reveals no gallop.   Pulmonary/Chest: Effort normal and breath sounds normal. No respiratory distress. She has no wheezes. She exhibits no tenderness.  Abdominal: Soft. Bowel sounds are normal. She exhibits no distension, no abdominal bruit and no mass. There is no tenderness.  Genitourinary: No breast swelling, tenderness, discharge or bleeding.  Breast exam: No mass, nodules, thickening, tenderness, bulging, retraction, inflamation, nipple discharge or skin changes noted.  No axillary or clavicular LA.      Musculoskeletal: Normal range of motion. She exhibits no edema or tenderness.  Lymphadenopathy:    She has no cervical adenopathy.  Neurological: She is alert. She has normal reflexes. No cranial nerve deficit. She exhibits normal muscle tone. Coordination normal.  Skin: Skin is warm and dry. No rash noted. No erythema. No pallor.  Psychiatric: She has a normal mood and affect.          Assessment & Plan:    Problem List Items Addressed This Visit      Cardiovascular and Mediastinum   Essential hypertension - Primary    bp in fair control at this time  BP Readings from Last 1 Encounters:  11/22/15 108/58   No changes needed Disc lifstyle change with low sodium diet and exercise   Labs today  No change in medication      Relevant Medications   lisinopril-hydrochlorothiazide (PRINZIDE,ZESTORETIC) 10-12.5 MG tablet   Other Relevant Orders   CBC with Differential/Platelet (Completed)   Comprehensive metabolic panel (Completed)   TSH (Completed)   Lipid panel (Completed)     Digestive   IBS    Improved since retirement/less stress Will refill levsin prn        Musculoskeletal and Integument  Osteopenia    Ref for dexa No falls or fx On ca and D  Disc need for calcium/ vitamin D/ wt bearing exercise and bone density test every 2 y to monitor Disc safety/ fracture risk in detail          Other   Routine general medical examination at a health care facility    Reviewed health habits including diet and exercise and skin cancer prevention Reviewed appropriate screening tests for age  Also reviewed health mt list, fam hx and immunization status , as well as social and family history   See HPI Labs today incl hep C screen Will schedule AMW visit  Disc inc calories because she is exercising more  Ref for dexa  She will schedule her own mammogram      Exposure to communicable disease    Hep C screen today      Relevant Orders   Hepatitis C antibody (Completed)   Estrogen deficiency   Relevant Orders   DG Bone Density

## 2015-11-22 NOTE — Progress Notes (Signed)
Pre visit review using our clinic review tool, if applicable. No additional management support is needed unless otherwise documented below in the visit note. 

## 2015-11-22 NOTE — Patient Instructions (Addendum)
Labs today  See Marie Kennedy for your medicare visit in the fall  Tell the staff to cancel the lab appt and visit with me in the fall because we did it today  Don't forget to set up your mammogram  No change in medicines

## 2015-11-23 LAB — HEPATITIS C ANTIBODY: HCV Ab: NEGATIVE

## 2015-11-23 NOTE — Assessment & Plan Note (Signed)
Ref for dexa No falls or fx On ca and D  Disc need for calcium/ vitamin D/ wt bearing exercise and bone density test every 2 y to monitor Disc safety/ fracture risk in detail

## 2015-11-23 NOTE — Assessment & Plan Note (Signed)
Hep C screen today 

## 2015-11-23 NOTE — Assessment & Plan Note (Signed)
bp in fair control at this time  BP Readings from Last 1 Encounters:  11/22/15 108/58   No changes needed Disc lifstyle change with low sodium diet and exercise   Labs today  No change in medication

## 2015-11-23 NOTE — Assessment & Plan Note (Addendum)
Reviewed health habits including diet and exercise and skin cancer prevention Reviewed appropriate screening tests for age  Also reviewed health mt list, fam hx and immunization status , as well as social and family history   See HPI Labs today incl hep C screen Will schedule AMW visit  Disc inc calories because she is exercising more  Ref for dexa  She will schedule her own mammogram

## 2015-11-23 NOTE — Assessment & Plan Note (Signed)
Improved since retirement/less stress Will refill levsin prn

## 2015-11-24 ENCOUNTER — Encounter: Payer: Self-pay | Admitting: *Deleted

## 2015-11-29 ENCOUNTER — Ambulatory Visit (INDEPENDENT_AMBULATORY_CARE_PROVIDER_SITE_OTHER): Payer: PPO

## 2015-11-29 VITALS — BP 106/58 | HR 60 | Temp 98.3°F | Ht 63.0 in | Wt 119.5 lb

## 2015-11-29 DIAGNOSIS — Z0000 Encounter for general adult medical examination without abnormal findings: Secondary | ICD-10-CM

## 2015-11-29 NOTE — Progress Notes (Signed)
Pre visit review using our clinic review tool, if applicable. No additional management support is needed unless otherwise documented below in the visit note. 

## 2015-11-29 NOTE — Progress Notes (Signed)
Subjective:   Marie Kennedy is a 70 y.o. female who presents for Medicare Annual (Subsequent) preventive examination.  Review of Systems:  N/A Cardiac Risk Factors include: advanced age (>26men, >20 women);hypertension     Objective:     Vitals: BP 106/58 mmHg  Pulse 60  Temp(Src) 98.3 F (36.8 C) (Oral)  Ht 5\' 3"  (1.6 m)  Wt 119 lb 8 oz (54.205 kg)  BMI 21.17 kg/m2  SpO2 97%  Body mass index is 21.17 kg/(m^2).   Tobacco History  Smoking status  . Never Smoker   Smokeless tobacco  . Never Used     Counseling given: No   Past Medical History  Diagnosis Date  . Rosacea   . IBS (irritable bowel syndrome)   . Fibroids 1993    cysts endometriosis  . GERD (gastroesophageal reflux disease)   . Hypertension     PCP   Dr Glori Bickers at [facility]  . Allergy   . Basal cell adenoma 11/14    face   Past Surgical History  Procedure Laterality Date  . Abdominal hysterectomy  1993  . Breast surgery Right 1970's    lumpectomy no cancer  . Tonsillectomy  1953  . Cholecystectomy  06/18/2011    Procedure: LAPAROSCOPIC CHOLECYSTECTOMY WITH INTRAOPERATIVE CHOLANGIOGRAM;  Surgeon: Joyice Faster. Cornett, MD;  Location: [facility];  Service: General;  Laterality: N/A;  lap chole with IOC  . Basal cell carcinoma excision  11/14    face  . Breast biopsy Right 1970    Negative   Family History  Problem Relation Age of Onset  . Hypertension Mother   . Cancer Mother     breast, uterine, and cervical  . Heart disease Father     CAD  . Hypertension Father   . Diabetes Father     TYPE II  . Cancer Father     brain  . Colon cancer Neg Hx    History  Sexual Activity  . Sexual Activity: Yes    Outpatient Encounter Prescriptions as of 11/29/2015  Medication Sig  . Calcium Carbonate-Vit D-Min 600-400 MG-UNIT TABS Take 1 tablet by mouth daily.   . DiphenhydrAMINE HCl (ALLERGY MED PO) Take 25 mg by mouth daily as needed. Allergies   . hyoscyamine (LEVSIN SL) 0.125 MG SL  tablet Place 1 tablet (0.125 mg total) under the tongue 2 (two) times daily as needed for cramping. Abdominal cramping  . lisinopril-hydrochlorothiazide (PRINZIDE,ZESTORETIC) 10-12.5 MG tablet Take 0.5 tablets by mouth every morning.  . metroNIDAZOLE (METROCREAM) 0.75 % cream Apply 1 application topically daily. Rosacea  . Multiple Vitamin (MULTIVITAMIN) tablet Take 1 tablet by mouth daily.    No facility-administered encounter medications on file as of 11/29/2015.    Activities of Daily Living In your present state of health, do you have any difficulty performing the following activities: 11/29/2015  Hearing? N  Vision? N  Difficulty concentrating or making decisions? N  Walking or climbing stairs? N  Dressing or bathing? N  Doing errands, shopping? N  Preparing Food and eating ? N  Using the Toilet? N  In the past six months, have you accidently leaked urine? N  Do you have problems with loss of bowel control? N  Managing your Medications? N  Managing your Finances? N  Housekeeping or managing your Housekeeping? N    Patient Care Team: Abner Greenspan, MD as PCP - General Ralene Bathe, MD as Referring Physician (Dermatology) Ronnell Freshwater, MD as  Referring Physician (Ophthalmology) Karsten Fells, DDS as Referring Physician (Dentistry)    Assessment:     Hearing Screening   125Hz  250Hz  500Hz  1000Hz  2000Hz  4000Hz  8000Hz   Right ear:   0 0 40 40   Left ear:   0 0 0 40   Vision Screening Comments: Last eye exam in Jun 2016 @ [facility]   Exercise Activities and Dietary recommendations Current Exercise Habits: Home exercise routine, Type of exercise: walking (walks 3 miles daily), Frequency (Times/Week): 7, Intensity: Mild, Exercise limited by: None identified  Goals    . Increase physical activity     Starting 11/29/15, I will continue to walk approx. 3 miles daily.       Fall Risk Fall Risk  11/29/2015 09/20/2013 06/22/2012  Falls in the past year? No  No No   Depression Screen PHQ 2/9 Scores 11/29/2015 06/22/2012  PHQ - 2 Score 0 0     Cognitive Testing MMSE - Mini Mental State Exam 11/29/2015  Orientation to time 5  Orientation to Place 5  Registration 3  Attention/ Calculation 0  Recall 3  Language- name 2 objects 0  Language- repeat 1  Language- follow 3 step command 3  Language- read & follow direction 0  Write a sentence 0  Copy design 0  Total score 20   PLEASE NOTE: A Mini-Cog screen was completed. Maximum score is 20. A value of 0 denotes this part of Folstein MMSE was not completed or the patient failed this part of the Mini-Cog screening.   Mini-Cog Screening Orientation to Time - Max 5 pts Orientation to Place - Max 5 pts Registration - Max 3 pts Recall - Max 3 pts Language Repeat - Max 1 pts Language Follow 3 Step Command - Max 3 pts  Immunization History  Administered Date(s) Administered  . Influenza Whole 04/30/2006  . Influenza,inj,Quad PF,36+ Mos 04/22/2014, 05/23/2015  . Pneumococcal Conjugate-13 11/16/2014  . Pneumococcal Polysaccharide-23 12/12/2010  . Td 07/20/2002, 06/22/2012  . Zoster 07/03/2012   Screening Tests Health Maintenance  Topic Date Due  . MAMMOGRAM  12/01/2015  . INFLUENZA VACCINE  02/06/2016  . COLONOSCOPY  12/04/2018  . TETANUS/TDAP  06/22/2022  . DEXA SCAN  Completed  . ZOSTAVAX  Completed  . Hepatitis C Screening  Completed  . PNA vac Low Risk Adult  Completed      Plan:     I have personally reviewed and addressed the Medicare Annual Wellness questionnaire and have noted the following in the patient's chart:  A. Medical and social history B. Use of alcohol, tobacco or illicit drugs  C. Current medications and supplements D. Functional ability and status E.  Nutritional status F.  Physical activity G. Advance directives H. List of other physicians I.  Hospitalizations, surgeries, and ER visits in previous 12 months J.  [place] to include hearing,  vision, cognitive, depression L. Referrals and appointments - none  In addition, I have reviewed and discussed with patient certain preventive protocols, quality metrics, and best practice recommendations. A written personalized care plan for preventive services as well as general preventive health recommendations were provided to patient.  See attached scanned questionnaire for additional information.   Signed,   Lindell Noe, MHA, BS, LPN Health Advisor

## 2015-11-29 NOTE — Patient Instructions (Signed)
Marie Kennedy , Thank you for taking time to come for your Medicare Wellness Visit. I appreciate your ongoing commitment to your health goals. Please review the following plan we discussed and let me know if I can assist you in the future.   These are the goals we discussed: Goals    . Increase physical activity     Starting 11/29/15, I will continue to walk approx. 3 miles daily.        This is a list of the screening recommended for you and due dates:  Health Maintenance  Topic Date Due  . Mammogram  12/01/2015  . Flu Shot  02/06/2016  . Colon Cancer Screening  12/04/2018  . Tetanus Vaccine  06/22/2022  . DEXA scan (bone density measurement)  Completed  . Shingles Vaccine  Completed  .  Hepatitis C: One time screening is recommended by Center for Disease Control  (CDC) for  adults born from [birth year] through 1965.   Completed  . Pneumonia vaccines  Completed    Preventive Care for Adults  A healthy lifestyle and preventive care can promote health and wellness. Preventive health guidelines for adults include the following key practices.  . A routine yearly physical is a good way to check with your health care provider about your health and preventive screening. It is a chance to share any concerns and updates on your health and to receive a thorough exam.  . Visit your dentist for a routine exam and preventive care every 6 months. Brush your teeth twice a day and floss once a day. Good oral hygiene prevents tooth decay and gum disease.  . The frequency of eye exams is based on your age, health, family medical history, use  of contact lenses, and other factors. Follow your health care provider's ecommendations for frequency of eye exams.  . Eat a healthy diet. Foods like vegetables, fruits, whole grains, low-fat dairy products, and lean protein foods contain the nutrients you need without too many calories. Decrease your intake of foods high in solid fats, added sugars, and salt. Eat the  right amount of calories for you. Get information about a proper diet from your health care provider, if necessary.  . Regular physical exercise is one of the most important things you can do for your health. Most adults should get at least 150 minutes of moderate-intensity exercise (any activity that increases your heart rate and causes you to sweat) each week. In addition, most adults need muscle-strengthening exercises on 2 or more days a week.  Silver Sneakers may be a benefit available to you. To determine eligibility, you may visit the website: www.silversneakers.com or contact program at 515-599-1382 Mon-Fri between 8AM-8PM.   . Maintain a healthy weight. The body mass index (BMI) is a screening tool to identify possible weight problems. It provides an estimate of body fat based on height and weight. Your health care provider can find your BMI and can help you achieve or maintain a healthy weight.   For adults 20 years and older: ? A BMI below 18.5 is considered underweight. ? A BMI of 18.5 to 24.9 is normal. ? A BMI of 25 to 29.9 is considered overweight. ? A BMI of 30 and above is considered obese.   . Maintain normal blood lipids and cholesterol levels by exercising and minimizing your intake of saturated fat. Eat a balanced diet with plenty of fruit and vegetables. Blood tests for lipids and cholesterol should begin at age 33 and be  repeated every 5 years. If your lipid or cholesterol levels are high, you are over 50, or you are at high risk for heart disease, you may need your cholesterol levels checked more frequently. Ongoing high lipid and cholesterol levels should be treated with medicines if diet and exercise are not working.  . If you smoke, find out from your health care provider how to quit. If you do not use tobacco, please do not start.  . If you choose to drink alcohol, please do not consume more than 2 drinks per day. One drink is considered to be 12 ounces (355 mL) of  beer, 5 ounces (148 mL) of wine, or 1.5 ounces (44 mL) of liquor.  . If you are 5-65 years old, ask your health care provider if you should take aspirin to prevent strokes.  . Use sunscreen. Apply sunscreen liberally and repeatedly throughout the day. You should seek shade when your shadow is shorter than you. Protect yourself by wearing long sleeves, pants, a wide-brimmed hat, and sunglasses year round, whenever you are outdoors.  . Once a month, do a whole body skin exam, using a mirror to look at the skin on your back. Tell your health care provider of new moles, moles that have irregular borders, moles that are larger than a pencil eraser, or moles that have changed in shape or color.

## 2015-11-29 NOTE — Progress Notes (Signed)
PCP notes:  Health maintenance: No gaps identified or addressed.   Abnormal screenings:   Hearing - failed.  Patient concerns: None  Nurse concerns: None  Next PCP appt: None

## 2015-11-30 NOTE — Progress Notes (Signed)
I reviewed health advisor's note, was available for consultation, and agree with documentation and plan.  

## 2015-12-11 ENCOUNTER — Ambulatory Visit
Admission: RE | Admit: 2015-12-11 | Discharge: 2015-12-11 | Disposition: A | Discharge status: Home / Self Care | Payer: PPO | Source: Ambulatory Visit | Attending: Family Medicine | Admitting: Family Medicine

## 2015-12-11 ENCOUNTER — Encounter: Payer: Self-pay | Admitting: Family Medicine

## 2015-12-11 ENCOUNTER — Other Ambulatory Visit: Payer: Self-pay | Admitting: Family Medicine

## 2015-12-11 DIAGNOSIS — Z13820 Encounter for screening for osteoporosis: Secondary | ICD-10-CM | POA: Diagnosis not present

## 2015-12-11 DIAGNOSIS — Z1231 Encounter for screening mammogram for malignant neoplasm of breast: Secondary | ICD-10-CM | POA: Diagnosis not present

## 2015-12-11 DIAGNOSIS — M810 Age-related osteoporosis without current pathological fracture: Secondary | ICD-10-CM | POA: Insufficient documentation

## 2015-12-11 DIAGNOSIS — E2839 Other primary ovarian failure: Secondary | ICD-10-CM

## 2015-12-11 HISTORY — DX: Malignant (primary) neoplasm, unspecified: C80.1

## 2015-12-11 LAB — HM DEXA SCAN

## 2015-12-11 LAB — HM MAMMOGRAPHY

## 2015-12-12 ENCOUNTER — Encounter: Payer: Self-pay | Admitting: *Deleted

## 2016-02-14 DIAGNOSIS — H2513 Age-related nuclear cataract, bilateral: Secondary | ICD-10-CM | POA: Diagnosis not present

## 2016-04-15 ENCOUNTER — Ambulatory Visit (INDEPENDENT_AMBULATORY_CARE_PROVIDER_SITE_OTHER): Payer: PPO

## 2016-04-15 DIAGNOSIS — Z23 Encounter for immunization: Secondary | ICD-10-CM

## 2016-04-17 ENCOUNTER — Ambulatory Visit: Payer: PPO

## 2016-04-17 ENCOUNTER — Other Ambulatory Visit: Payer: PPO

## 2016-04-19 DIAGNOSIS — L718 Other rosacea: Secondary | ICD-10-CM | POA: Diagnosis not present

## 2016-04-19 DIAGNOSIS — L578 Other skin changes due to chronic exposure to nonionizing radiation: Secondary | ICD-10-CM | POA: Diagnosis not present

## 2016-04-19 DIAGNOSIS — Z85828 Personal history of other malignant neoplasm of skin: Secondary | ICD-10-CM | POA: Diagnosis not present

## 2016-04-24 ENCOUNTER — Ambulatory Visit: Payer: PPO

## 2016-04-26 ENCOUNTER — Encounter: Payer: PPO | Admitting: Family Medicine

## 2016-05-14 DIAGNOSIS — Z85828 Personal history of other malignant neoplasm of skin: Secondary | ICD-10-CM

## 2016-05-14 DIAGNOSIS — C44319 Basal cell carcinoma of skin of other parts of face: Secondary | ICD-10-CM | POA: Diagnosis not present

## 2016-05-14 HISTORY — DX: Personal history of other malignant neoplasm of skin: Z85.828

## 2016-08-22 DIAGNOSIS — Z85828 Personal history of other malignant neoplasm of skin: Secondary | ICD-10-CM | POA: Diagnosis not present

## 2016-08-22 DIAGNOSIS — Z1283 Encounter for screening for malignant neoplasm of skin: Secondary | ICD-10-CM | POA: Diagnosis not present

## 2016-08-22 DIAGNOSIS — L812 Freckles: Secondary | ICD-10-CM | POA: Diagnosis not present

## 2016-08-22 DIAGNOSIS — L821 Other seborrheic keratosis: Secondary | ICD-10-CM | POA: Diagnosis not present

## 2016-08-22 DIAGNOSIS — D1800 Hemangioma unspecified site: Secondary | ICD-10-CM | POA: Diagnosis not present

## 2016-08-22 DIAGNOSIS — D229 Melanocytic nevi, unspecified: Secondary | ICD-10-CM | POA: Diagnosis not present

## 2016-08-22 DIAGNOSIS — L578 Other skin changes due to chronic exposure to nonionizing radiation: Secondary | ICD-10-CM | POA: Diagnosis not present

## 2016-08-22 DIAGNOSIS — L718 Other rosacea: Secondary | ICD-10-CM | POA: Diagnosis not present

## 2016-12-01 ENCOUNTER — Telehealth: Payer: Self-pay | Admitting: Family Medicine

## 2016-12-01 DIAGNOSIS — Z0000 Encounter for general adult medical examination without abnormal findings: Secondary | ICD-10-CM

## 2016-12-01 NOTE — Telephone Encounter (Signed)
-----   Message from Ellamae Sia sent at 11/25/2016  4:08 PM EDT ----- Regarding: Lab orders for Wednesday, 5.30.18  AWV lab orders, please.

## 2016-12-04 ENCOUNTER — Other Ambulatory Visit: Payer: PPO

## 2016-12-04 ENCOUNTER — Ambulatory Visit (INDEPENDENT_AMBULATORY_CARE_PROVIDER_SITE_OTHER): Payer: PPO

## 2016-12-04 VITALS — BP 100/62 | HR 62 | Temp 98.0°F | Ht 62.5 in | Wt 120.0 lb

## 2016-12-04 DIAGNOSIS — Z0000 Encounter for general adult medical examination without abnormal findings: Secondary | ICD-10-CM | POA: Diagnosis not present

## 2016-12-04 LAB — CBC WITH DIFFERENTIAL/PLATELET
Basophils Absolute: 0.1 10*3/uL (ref 0.0–0.1)
Basophils Relative: 1.2 % (ref 0.0–3.0)
EOS ABS: 0.2 10*3/uL (ref 0.0–0.7)
EOS PCT: 4.1 % (ref 0.0–5.0)
HCT: 41 % (ref 36.0–46.0)
Hemoglobin: 13.5 g/dL (ref 12.0–15.0)
Lymphocytes Relative: 29.8 % (ref 12.0–46.0)
Lymphs Abs: 1.3 10*3/uL (ref 0.7–4.0)
MCHC: 33 g/dL (ref 30.0–36.0)
MCV: 92.1 fl (ref 78.0–100.0)
MONO ABS: 0.4 10*3/uL (ref 0.1–1.0)
Monocytes Relative: 9.8 % (ref 3.0–12.0)
Neutro Abs: 2.4 10*3/uL (ref 1.4–7.7)
Neutrophils Relative %: 55.1 % (ref 43.0–77.0)
PLATELETS: 207 10*3/uL (ref 150.0–400.0)
RBC: 4.45 Mil/uL (ref 3.87–5.11)
RDW: 13.5 % (ref 11.5–15.5)
WBC: 4.4 10*3/uL (ref 4.0–10.5)

## 2016-12-04 LAB — COMPREHENSIVE METABOLIC PANEL
ALBUMIN: 4.2 g/dL (ref 3.5–5.2)
ALT: 20 U/L (ref 0–35)
AST: 21 U/L (ref 0–37)
Alkaline Phosphatase: 71 U/L (ref 39–117)
BUN: 19 mg/dL (ref 6–23)
CHLORIDE: 102 meq/L (ref 96–112)
CO2: 32 mEq/L (ref 19–32)
Calcium: 9.6 mg/dL (ref 8.4–10.5)
Creatinine, Ser: 0.88 mg/dL (ref 0.40–1.20)
GFR: 67.33 mL/min (ref >60.00)
Glucose, Bld: 91 mg/dL (ref 70–99)
POTASSIUM: 4.3 meq/L (ref 3.5–5.1)
SODIUM: 136 meq/L (ref 135–145)
Total Bilirubin: 1.3 mg/dL — ABNORMAL HIGH (ref 0.2–1.2)
Total Protein: 7 g/dL (ref 6.0–8.3)

## 2016-12-04 LAB — LIPID PANEL
Cholesterol: 185 mg/dL (ref 0–200)
HDL: 84.6 mg/dL (ref >39.00)
LDL Cholesterol: 85 mg/dL (ref 0–99)
NonHDL: 100.09
TRIGLYCERIDES: 75 mg/dL (ref 0.0–149.0)
Total CHOL/HDL Ratio: 2
VLDL: 15 mg/dL (ref 0.0–40.0)

## 2016-12-04 LAB — TSH: TSH: 1.74 u[IU]/mL (ref 0.35–4.50)

## 2016-12-04 NOTE — Progress Notes (Signed)
Subjective:   Marie Kennedy is a 71 y.o. female who presents for Medicare Annual (Subsequent) preventive examination.  Review of Systems:  N/A Cardiac Risk Factors include: advanced age (>18men, >63 women);hypertension     Objective:     Vitals: BP 100/62 (BP Location: Left Arm, Patient Position: Sitting, Cuff Size: Normal)   Pulse 62   Temp 98 F (36.7 C) (Oral)   Ht 5' 2.5" (1.588 m) Comment: no shoes  Wt 120 lb (54.4 kg)   SpO2 97%   BMI 21.60 kg/m   Body mass index is 21.6 kg/m.   Tobacco History  Smoking Status  . Never Smoker  Smokeless Tobacco  . Never Used     Counseling given: No   Past Medical History:  Diagnosis Date  . Allergy   . Basal cell adenoma 11/14   face  . Cancer ([place])    skin  . Fibroids 1993   cysts endometriosis  . GERD (gastroesophageal reflux disease)   . Hypertension    PCP   Dr Glori Bickers at [facility]  . IBS (irritable bowel syndrome)   . Rosacea    Past Surgical History:  Procedure Laterality Date  . ABDOMINAL HYSTERECTOMY  1993  . BASAL CELL CARCINOMA EXCISION  11/14   face  . BREAST BIOPSY Right 1970   Negative  . BREAST SURGERY Right 1970's   lumpectomy no cancer  . CHOLECYSTECTOMY  06/18/2011   Procedure: LAPAROSCOPIC CHOLECYSTECTOMY WITH INTRAOPERATIVE CHOLANGIOGRAM;  Surgeon: Joyice Faster. Cornett, MD;  Location: [facility];  Service: General;  Laterality: N/A;  lap chole with IOC  . TONSILLECTOMY  1953   Family History  Problem Relation Age of Onset  . Hypertension Mother   . Cancer Mother        breast, uterine, and cervical  . Breast cancer Mother 7  . Heart disease Father        CAD  . Hypertension Father   . Diabetes Father        TYPE II  . Cancer Father        brain  . Colon cancer Neg Hx    History  Sexual Activity  . Sexual activity: Yes    Outpatient Encounter Prescriptions as of 12/04/2016  Medication Sig  . Calcium Carbonate-Vit D-Min 600-400 MG-UNIT TABS Take 1 tablet by mouth daily.    . DiphenhydrAMINE HCl (ALLERGY MED PO) Take 25 mg by mouth daily as needed. Allergies   . hyoscyamine (LEVSIN SL) 0.125 MG SL tablet Place 1 tablet (0.125 mg total) under the tongue 2 (two) times daily as needed for cramping. Abdominal cramping  . lisinopril-hydrochlorothiazide (PRINZIDE,ZESTORETIC) 10-12.5 MG tablet Take 0.5 tablets by mouth every morning.  . metroNIDAZOLE (METROCREAM) 0.75 % cream Apply 1 application topically daily. Rosacea  . Multiple Vitamin (MULTIVITAMIN) tablet Take 1 tablet by mouth daily.    No facility-administered encounter medications on file as of 12/04/2016.     Activities of Daily Living In your present state of health, do you have any difficulty performing the following activities: 12/04/2016  Hearing? N  Vision? Y  Difficulty concentrating or making decisions? N  Walking or climbing stairs? N  Dressing or bathing? N  Doing errands, shopping? N  Preparing Food and eating ? N  Using the Toilet? N  In the past six months, have you accidently leaked urine? N  Do you have problems with loss of bowel control? N  Managing your Medications? N  Managing your Finances?  N  Housekeeping or managing your Housekeeping? N  Some recent data might be hidden    Patient Care Team: Tower, Wynelle Fanny, MD as PCP - General Ralene Bathe, MD as Referring Physician (Dermatology) Karren Burly Deirdre Peer, MD as Referring Physician (Ophthalmology) Karsten Fells, DDS as Referring Physician (Dentistry)    Assessment:     Hearing Screening   125Hz  250Hz  500Hz  1000Hz  2000Hz  3000Hz  4000Hz  6000Hz  8000Hz   Right ear:   0 0 40  40    Left ear:   0 0 0  0    Vision Screening Comments: Last vision exam in June 2017 @ [facility] ' Exercise Activities and Dietary recommendations Current Exercise Habits: Home exercise routine, Type of exercise: walking, Time (Minutes): 60, Frequency (Times/Week): 7, Weekly Exercise (Minutes/Week): 420, Intensity: Moderate, Exercise  limited by: None identified  Goals    . Increase physical activity          Starting 12/04/2016, I will continue to walk approx. 3 miles daily.       Fall Risk Fall Risk  12/04/2016 11/29/2015 09/20/2013 06/22/2012  Falls in the past year? No No No No   Depression Screen PHQ 2/9 Scores 12/04/2016 11/29/2015 06/22/2012  PHQ - 2 Score 0 0 0     Cognitive Function MMSE - Mini Mental State Exam 12/04/2016 11/29/2015  Orientation to time 5 5  Orientation to Place 5 5  Registration 3 3  Attention/ Calculation 0 0  Recall 3 3  Language- name 2 objects 0 0  Language- repeat 1 1  Language- follow 3 step command 3 3  Language- read & follow direction 0 0  Write a sentence 0 0  Copy design 0 0  Total score 20 20     PLEASE NOTE: A Mini-Cog screen was completed. Maximum score is 20. A value of 0 denotes this part of Folstein MMSE was not completed or the patient failed this part of the Mini-Cog screening.   Mini-Cog Screening Orientation to Time - Max 5 pts Orientation to Place - Max 5 pts Registration - Max 3 pts Recall - Max 3 pts Language Repeat - Max 1 pts Language Follow 3 Step Command - Max 3 pts       Immunization History  Administered Date(s) Administered  . Influenza Whole 04/30/2006  . Influenza,inj,Quad PF,36+ Mos 04/22/2014, 05/23/2015, 04/15/2016  . Pneumococcal Conjugate-13 11/16/2014  . Pneumococcal Polysaccharide-23 12/12/2010  . Td 07/20/2002, 06/22/2012  . Zoster 07/03/2012   Screening Tests Health Maintenance  Topic Date Due  . MAMMOGRAM  12/10/2016  . INFLUENZA VACCINE  02/05/2017  . COLONOSCOPY  12/04/2018  . TETANUS/TDAP  06/22/2022  . DEXA SCAN  Completed  . Hepatitis C Screening  Completed  . PNA vac Low Risk Adult  Completed      Plan:    I have personally reviewed and addressed the Medicare Annual Wellness questionnaire and have noted the following in the patient's chart:  A. Medical and social history B. Use of alcohol, tobacco or illicit  drugs  C. Current medications and supplements D. Functional ability and status E.  Nutritional status F.  Physical activity G. Advance directives H. List of other physicians I.  Hospitalizations, surgeries, and ER visits in previous 12 months J.  [place] to include hearing, vision, cognitive, depression L. Referrals and appointments - none  In addition, I have reviewed and discussed with patient certain preventive protocols, quality metrics, and best practice recommendations. A written personalized care  plan for preventive services as well as general preventive health recommendations were provided to patient.  See attached scanned questionnaire for additional information.   Signed,   Lindell Noe, MHA, BS, LPN Health Coach

## 2016-12-04 NOTE — Progress Notes (Signed)
I reviewed health advisor's note, was available for consultation, and agree with documentation and plan.  

## 2016-12-04 NOTE — Progress Notes (Signed)
Pre visit review using our clinic review tool, if applicable. No additional management support is needed unless otherwise documented below in the visit note. 

## 2016-12-04 NOTE — Patient Instructions (Addendum)
Marie Kennedy , Thank you for taking time to come for your Medicare Wellness Visit. I appreciate your ongoing commitment to your health goals. Please review the following plan we discussed and let me know if I can assist you in the future.   These are the goals we discussed: Goals    . Increase physical activity          Starting 12/04/2016, I will continue to walk approx. 3 miles daily.        This is a list of the screening recommended for you and due dates:  Health Maintenance  Topic Date Due  . Mammogram  12/10/2016  . Flu Shot  02/05/2017  . Colon Cancer Screening  12/04/2018  . Tetanus Vaccine  06/22/2022  . DEXA scan (bone density measurement)  Completed  .  Hepatitis C: One time screening is recommended by Center for Disease Control  (CDC) for  adults born from [birth year] through 1965.   Completed  . Pneumonia vaccines  Completed   Preventive Care for Adults  A healthy lifestyle and preventive care can promote health and wellness. Preventive health guidelines for adults include the following key practices.  . A routine yearly physical is a good way to check with your health care provider about your health and preventive screening. It is a chance to share any concerns and updates on your health and to receive a thorough exam.  . Visit your dentist for a routine exam and preventive care every 6 months. Brush your teeth twice a day and floss once a day. Good oral hygiene prevents tooth decay and gum disease.  . The frequency of eye exams is based on your age, health, family medical history, use  of contact lenses, and other factors. Follow your health care provider's ecommendations for frequency of eye exams.  . Eat a healthy diet. Foods like vegetables, fruits, whole grains, low-fat dairy products, and lean protein foods contain the nutrients you need without too many calories. Decrease your intake of foods high in solid fats, added sugars, and salt. Eat the right amount of calories  for you. Get information about a proper diet from your health care provider, if necessary.  . Regular physical exercise is one of the most important things you can do for your health. Most adults should get at least 150 minutes of moderate-intensity exercise (any activity that increases your heart rate and causes you to sweat) each week. In addition, most adults need muscle-strengthening exercises on 2 or more days a week.  Silver Sneakers may be a benefit available to you. To determine eligibility, you may visit the website: www.silversneakers.com or contact program at 505 145 3594 Mon-Fri between 8AM-8PM.   . Maintain a healthy weight. The body mass index (BMI) is a screening tool to identify possible weight problems. It provides an estimate of body fat based on height and weight. Your health care provider can find your BMI and can help you achieve or maintain a healthy weight.   For adults 20 years and older: ? A BMI below 18.5 is considered underweight. ? A BMI of 18.5 to 24.9 is normal. ? A BMI of 25 to 29.9 is considered overweight. ? A BMI of 30 and above is considered obese.   . Maintain normal blood lipids and cholesterol levels by exercising and minimizing your intake of saturated fat. Eat a balanced diet with plenty of fruit and vegetables. Blood tests for lipids and cholesterol should begin at age 30 and be repeated every  5 years. If your lipid or cholesterol levels are high, you are over 50, or you are at high risk for heart disease, you may need your cholesterol levels checked more frequently. Ongoing high lipid and cholesterol levels should be treated with medicines if diet and exercise are not working.  . If you smoke, find out from your health care provider how to quit. If you do not use tobacco, please do not start.  . If you choose to drink alcohol, please do not consume more than 2 drinks per day. One drink is considered to be 12 ounces (355 mL) of beer, 5 ounces (148 mL) of  wine, or 1.5 ounces (44 mL) of liquor.  . If you are 39-60 years old, ask your health care provider if you should take aspirin to prevent strokes.  . Use sunscreen. Apply sunscreen liberally and repeatedly throughout the day. You should seek shade when your shadow is shorter than you. Protect yourself by wearing long sleeves, pants, a wide-brimmed hat, and sunglasses year round, whenever you are outdoors.  . Once a month, do a whole body skin exam, using a mirror to look at the skin on your back. Tell your health care provider of new moles, moles that have irregular borders, moles that are larger than a pencil eraser, or moles that have changed in shape or color.

## 2016-12-04 NOTE — Progress Notes (Signed)
PCP notes:   Health maintenance:  No gaps identified.   Abnormal screenings:   Hearing - failed  Patient concerns:   None  Nurse concerns:  None  Next PCP appt:   12/11/16 @ 1430

## 2016-12-11 ENCOUNTER — Other Ambulatory Visit: Payer: Self-pay | Admitting: Family Medicine

## 2016-12-11 ENCOUNTER — Ambulatory Visit (INDEPENDENT_AMBULATORY_CARE_PROVIDER_SITE_OTHER): Payer: PPO | Admitting: Family Medicine

## 2016-12-11 ENCOUNTER — Encounter: Payer: Self-pay | Admitting: Family Medicine

## 2016-12-11 VITALS — BP 106/62 | HR 65 | Temp 97.7°F | Ht 62.5 in | Wt 122.5 lb

## 2016-12-11 DIAGNOSIS — M810 Age-related osteoporosis without current pathological fracture: Secondary | ICD-10-CM

## 2016-12-11 DIAGNOSIS — Z1211 Encounter for screening for malignant neoplasm of colon: Secondary | ICD-10-CM | POA: Diagnosis not present

## 2016-12-11 DIAGNOSIS — I10 Essential (primary) hypertension: Secondary | ICD-10-CM

## 2016-12-11 DIAGNOSIS — Z1231 Encounter for screening mammogram for malignant neoplasm of breast: Secondary | ICD-10-CM

## 2016-12-11 DIAGNOSIS — Z0000 Encounter for general adult medical examination without abnormal findings: Secondary | ICD-10-CM

## 2016-12-11 MED ORDER — HYOSCYAMINE SULFATE 0.125 MG SL SUBL: 0.1250 mg | SUBLINGUAL_TABLET | Freq: Two times a day (BID) | SUBLINGUAL | 11 refills | Status: DC | PRN

## 2016-12-11 MED ORDER — LISINOPRIL-HYDROCHLOROTHIAZIDE 10-12.5 MG PO TABS: 0.5000 | ORAL_TABLET | ORAL | 11 refills | Status: DC

## 2016-12-11 NOTE — Patient Instructions (Addendum)
Don't forget to schedule your mammogram at [facility]  I recommend Dr George Ina for cataract evaluation at [facility]   Stay active - physically and mentally   Let's keep an eye on your hearing - if you notice problems let us know   Keep working outdoors but don't get sunburned

## 2016-12-11 NOTE — Assessment & Plan Note (Signed)
bp in fair control at this time  BP Readings from Last 1 Encounters:  12/11/16 106/62   No changes needed Disc lifstyle change with low sodium diet and exercise  Labs rev Refilled lisinopril hct

## 2016-12-11 NOTE — Assessment & Plan Note (Signed)
Due for recall 2020 colonoscopy Hx of adenomatous polyps

## 2016-12-11 NOTE — Assessment & Plan Note (Signed)
Reviewed health habits including diet and exercise and skin cancer prevention Reviewed appropriate screening tests for age  Also reviewed health mt list, fam hx and immunization status , as well as social and family history   See HPI AMW reviewed  Labs reviewed She will schedule her mammogram at [facility] -given number  She will schedule eye exam - recommendations made  (has bothersome cataract) Disc safety in regards to hearing- will update if she wants further eval  Declines OP tx -will continue ca and D/ disc fall prev) Labs look good

## 2016-12-11 NOTE — Progress Notes (Signed)
Subjective:    Patient ID: Marie Kennedy, female    DOB: 04-18-1946, 71 y.o.   MRN: [id number]  HPI  Here for health maintenance exam and to review chronic medical problems    Feeling good /no c/o   Had amw 5/30 Hearing screen - failed / no tones heard with L ear  She has not noticed hearing loss (loud tv because husband cannot hear)  Does not bother her  No gaps   Wt Readings from Last 3 Encounters:  12/11/16 122 lb 8 oz (55.6 kg)  12/04/16 120 lb (54.4 kg)  11/29/15 119 lb 8 oz (54.2 kg)  good weight - stable  Appetite is fine  Walks every day for exercise  bmi 22.0  Mammogram 6/17-normal- does not have it set up Marie Halim do herself  Self breast exam-no lumps  Mother had breast cancer (as well as uterine and cervical)  No other breast cancer in family   Needs eye exam  Thinks her cataracts are getting worse   Hysterectomy in the 1990s No gyn symptoms   Colonoscopy 5/15-adenomatous polyp  5 year recall   dexa 6/17- OP with slt dec bmd at [facility] No falls or fractures  Taking ca and D  Never been on medication  ? If interested yet in treatment   Zoster status- zostavax 12/13  bp is stable today  No cp or palpitations or headaches or edema  No side effects to medicines  BP Readings from Last 3 Encounters:  12/11/16 106/62  12/04/16 100/62  11/29/15 (!) 106/58     Results for orders placed or performed in visit on 12/04/16  CBC with Differential/Platelet  Result Value Ref Range   WBC 4.4 4.0 - 10.5 K/uL   RBC 4.45 3.87 - 5.11 Mil/uL   Hemoglobin 13.5 12.0 - 15.0 g/dL   HCT 41.0 36.0 - 46.0 %   MCV 92.1 78.0 - 100.0 fl   MCHC 33.0 30.0 - 36.0 g/dL   RDW 13.5 11.5 - 15.5 %   Platelets 207.0 150.0 - 400.0 K/uL   Neutrophils Relative % 55.1 43.0 - 77.0 %   Lymphocytes Relative 29.8 12.0 - 46.0 %   Monocytes Relative 9.8 3.0 - 12.0 %   Eosinophils Relative 4.1 0.0 - 5.0 %   Basophils Relative 1.2 0.0 - 3.0 %   Neutro Abs 2.4 1.4 - 7.7 K/uL   Lymphs Abs  1.3 0.7 - 4.0 K/uL   Monocytes Absolute 0.4 0.1 - 1.0 K/uL   Eosinophils Absolute 0.2 0.0 - 0.7 K/uL   Basophils Absolute 0.1 0.0 - 0.1 K/uL  Comprehensive metabolic panel  Result Value Ref Range   Sodium 136 135 - 145 mEq/L   Potassium 4.3 3.5 - 5.1 mEq/L   Chloride 102 96 - 112 mEq/L   CO2 32 19 - 32 mEq/L   Glucose, Bld 91 70 - 99 mg/dL   BUN 19 6 - 23 mg/dL   Creatinine, Ser 0.88 0.40 - 1.20 mg/dL   Total Bilirubin 1.3 (H) 0.2 - 1.2 mg/dL   Alkaline Phosphatase 71 39 - 117 U/L   AST 21 0 - 37 U/L   ALT 20 0 - 35 U/L   Total Protein 7.0 6.0 - 8.3 g/dL   Albumin 4.2 3.5 - 5.2 g/dL   Calcium 9.6 8.4 - 10.5 mg/dL   GFR 67.33 >60.00 mL/min  Lipid panel  Result Value Ref Range   Cholesterol 185 0 - 200 mg/dL   Triglycerides  75.0 0.0 - 149.0 mg/dL   HDL 84.60 >39.00 mg/dL   VLDL 15.0 0.0 - 40.0 mg/dL   LDL Cholesterol 85 0 - 99 mg/dL   Total CHOL/HDL Ratio 2    NonHDL 100.09   TSH  Result Value Ref Range   TSH 1.74 0.35 - 4.50 uIU/mL     Eats a healthy diet   Patient Active Problem List   Diagnosis Date Noted  . Routine general medical examination at a health care facility 11/22/2015  . Estrogen deficiency 11/22/2015  . Exposure to communicable disease 11/22/2015  . Colon cancer screening 06/22/2012  . Hyperbilirubinemia 06/22/2012  . Osteoporosis 12/12/2010  . Post-menopausal 12/12/2010  . Other screening mammogram 12/04/2010  . ALLERGIC RHINITIS, SEASONAL 11/13/2009  . DEPRESSION 05/04/2007  . Essential hypertension 05/04/2007  . IBS 05/04/2007  . ROSACEA 05/04/2007   Past Medical History:  Diagnosis Date  . Allergy   . Basal cell adenoma 11/14   face  . Cancer (Marie Kennedy)    skin  . Fibroids 1993   cysts endometriosis  . GERD (gastroesophageal reflux disease)   . Hypertension    PCP   Dr Glori Bickers at [facility]  . IBS (irritable bowel syndrome)   . Rosacea    Past Surgical History:  Procedure Laterality Date  . ABDOMINAL HYSTERECTOMY  1993  .  BASAL CELL CARCINOMA EXCISION  11/14   face  . BREAST BIOPSY Right 1970   Negative  . BREAST SURGERY Right 1970's   lumpectomy no cancer  . CHOLECYSTECTOMY  06/18/2011   Procedure: LAPAROSCOPIC CHOLECYSTECTOMY WITH INTRAOPERATIVE CHOLANGIOGRAM;  Surgeon: Joyice Faster. Cornett, MD;  Location: [facility];  Service: General;  Laterality: N/A;  lap chole with IOC  . TONSILLECTOMY  1953   Social History  Substance Use Topics  . Smoking status: Never Smoker  . Smokeless tobacco: Never Used  . Alcohol use No   Family History  Problem Relation Age of Onset  . Hypertension Mother   . Cancer Mother        breast, uterine, and cervical  . Breast cancer Mother 45  . Heart disease Father        CAD  . Hypertension Father   . Diabetes Father        TYPE II  . Cancer Father        brain  . Colon cancer Neg Hx    No Known Allergies Current Outpatient Prescriptions on File Prior to Visit  Medication Sig Dispense Refill  . Calcium Carbonate-Vit D-Min 600-400 MG-UNIT TABS Take 1 tablet by mouth daily.     . DiphenhydrAMINE HCl (ALLERGY MED PO) Take 25 mg by mouth daily as needed. Allergies     . metroNIDAZOLE (METROCREAM) 0.75 % cream Apply 1 application topically daily. Rosacea    . Multiple Vitamin (MULTIVITAMIN) tablet Take 1 tablet by mouth daily.      No current facility-administered medications on file prior to visit.     Review of Systems  Constitutional: Negative for activity change, appetite change, fatigue, fever and unexpected weight change.  HENT: Negative for congestion, rhinorrhea, sore throat and trouble swallowing.   Eyes: Negative for pain, redness, itching and visual disturbance.  Respiratory: Negative for cough, chest tightness, shortness of breath and wheezing.   Cardiovascular: Negative for chest pain and palpitations.  Gastrointestinal: Negative for abdominal pain, blood in stool, constipation, diarrhea and nausea.  Endocrine: Negative for cold intolerance, heat  intolerance, polydipsia and polyuria.  Genitourinary: Negative for  difficulty urinating, dysuria, frequency and urgency.  Musculoskeletal: Negative for arthralgias, joint swelling and myalgias.  Skin: Negative for pallor and rash.  Neurological: Negative for dizziness, tremors, weakness, numbness and headaches.  Hematological: Negative for adenopathy. Does not bruise/bleed easily.  Psychiatric/Behavioral: Negative for decreased concentration and dysphoric mood. The patient is not nervous/anxious.           Objective:   Physical Exam  Constitutional: She appears well-developed and well-nourished. No distress.  Well appearing elderly female   HENT:  Head: Normocephalic and atraumatic.  Right Ear: External ear normal.  Left Ear: External ear normal.  Mouth/Throat: Oropharynx is clear and moist.  Eyes: Conjunctivae and EOM are normal. Pupils are equal, round, and reactive to light. No scleral icterus.  Neck: Normal range of motion. Neck supple. No JVD present. Carotid bruit is not present. No thyromegaly present.  Cardiovascular: Normal rate, regular rhythm, normal heart sounds and intact distal pulses.  Exam reveals no gallop.   Pulmonary/Chest: Effort normal and breath sounds normal. No respiratory distress. She has no wheezes. She exhibits no tenderness.  Abdominal: Soft. Bowel sounds are normal. She exhibits no distension, no abdominal bruit and no mass. There is no tenderness.  Genitourinary: No breast swelling, tenderness, discharge or bleeding.  Genitourinary Comments: Breast exam: No mass, nodules, thickening, tenderness, bulging, retraction, inflamation, nipple discharge or skin changes noted.  No axillary or clavicular LA.       Musculoskeletal: Normal range of motion. She exhibits no edema or tenderness.  Mild kyphosis   Lymphadenopathy:    She has no cervical adenopathy.  Neurological: She is alert. She has normal reflexes. No cranial nerve deficit. She exhibits normal  muscle tone. Coordination normal.  Skin: Skin is warm and dry. No rash noted. No erythema. No pallor.  Fair complexion Some angiomas   Psychiatric: She has a normal mood and affect.  Pleasant and talkative  Mentally sharp          Assessment & Plan:   Problem List Items Addressed This Visit      Cardiovascular and Mediastinum   Essential hypertension - Primary    bp in fair control at this time  BP Readings from Last 1 Encounters:  12/11/16 106/62   No changes needed Disc lifstyle change with low sodium diet and exercise  Labs rev Refilled lisinopril hct      Relevant Medications   lisinopril-hydrochlorothiazide (PRINZIDE,ZESTORETIC) 10-12.5 MG tablet     Musculoskeletal and Integument   Osteoporosis    Rev dexa 6/17 Will repeat in a year Pt does not desire tx at this time Will continue ca/D /exercise  No falls or fractures         Other   Colon cancer screening    Due for recall 2020 colonoscopy Hx of adenomatous polyps      Routine general medical examination at a health care facility    Reviewed health habits including diet and exercise and skin cancer prevention Reviewed appropriate screening tests for age  Also reviewed health mt list, fam hx and immunization status , as well as social and family history   See HPI AMW reviewed  Labs reviewed She will schedule her mamm[facility] at [facility] -given number  She will schedule eye exam - recommendations made  (has bothersome cataract) Disc safety in regards to hearing- will update if she wants further eval  Declines OP tx -will continue ca and D/ disc fall prev) Labs look good

## 2016-12-11 NOTE — Assessment & Plan Note (Signed)
Rev dexa 6/17 Will repeat in a year Pt does not desire tx at this time Will continue ca/D /exercise  No falls or fractures

## 2016-12-23 ENCOUNTER — Ambulatory Visit
Admission: RE | Admit: 2016-12-23 | Discharge: 2016-12-23 | Disposition: A | Discharge status: Home / Self Care | Payer: PPO | Source: Ambulatory Visit | Attending: Family Medicine | Admitting: Family Medicine

## 2016-12-23 DIAGNOSIS — Z1231 Encounter for screening mammogram for malignant neoplasm of breast: Secondary | ICD-10-CM | POA: Insufficient documentation

## 2016-12-24 ENCOUNTER — Encounter: Payer: Self-pay | Admitting: *Deleted

## 2017-04-10 DIAGNOSIS — H2513 Age-related nuclear cataract, bilateral: Secondary | ICD-10-CM | POA: Diagnosis not present

## 2017-05-01 ENCOUNTER — Ambulatory Visit (INDEPENDENT_AMBULATORY_CARE_PROVIDER_SITE_OTHER): Payer: PPO

## 2017-05-01 DIAGNOSIS — Z23 Encounter for immunization: Secondary | ICD-10-CM

## 2017-05-14 DIAGNOSIS — H2512 Age-related nuclear cataract, left eye: Secondary | ICD-10-CM | POA: Diagnosis not present

## 2017-05-16 NOTE — Discharge Instructions (Signed)

## 2017-05-19 ENCOUNTER — Encounter: Payer: Self-pay | Admitting: *Deleted

## 2017-05-19 ENCOUNTER — Encounter
Admission: RE | Disposition: A | Discharge status: Home / Self Care | Payer: Self-pay | Source: Ambulatory Visit | Attending: Ophthalmology

## 2017-05-19 ENCOUNTER — Ambulatory Visit: Payer: PPO | Admitting: Anesthesiology

## 2017-05-19 ENCOUNTER — Ambulatory Visit
Admission: RE | Admit: 2017-05-19 | Discharge: 2017-05-19 | Disposition: A | Discharge status: Home / Self Care | Payer: PPO | Source: Ambulatory Visit | Attending: Ophthalmology | Admitting: Ophthalmology

## 2017-05-19 DIAGNOSIS — Z79899 Other long term (current) drug therapy: Secondary | ICD-10-CM | POA: Insufficient documentation

## 2017-05-19 DIAGNOSIS — I10 Essential (primary) hypertension: Secondary | ICD-10-CM | POA: Insufficient documentation

## 2017-05-19 DIAGNOSIS — M810 Age-related osteoporosis without current pathological fracture: Secondary | ICD-10-CM | POA: Diagnosis not present

## 2017-05-19 DIAGNOSIS — Z85828 Personal history of other malignant neoplasm of skin: Secondary | ICD-10-CM | POA: Diagnosis not present

## 2017-05-19 DIAGNOSIS — H2512 Age-related nuclear cataract, left eye: Secondary | ICD-10-CM | POA: Insufficient documentation

## 2017-05-19 HISTORY — PX: CATARACT EXTRACTION W/PHACO: SHX586

## 2017-05-19 SURGERY — PHACOEMULSIFICATION, CATARACT, WITH IOL INSERTION
Anesthesia: Monitor Anesthesia Care | Laterality: Left | Wound class: Clean

## 2017-05-19 MED ORDER — MIDAZOLAM HCL 2 MG/2ML IJ SOLN
INTRAMUSCULAR | Status: DC | PRN
Start: 2017-05-19 — End: 2017-05-19
  Administered 2017-05-19: 2 mg via INTRAVENOUS

## 2017-05-19 MED ORDER — BRIMONIDINE TARTRATE-TIMOLOL 0.2-0.5 % OP SOLN
OPHTHALMIC | Status: DC | PRN
  Administered 2017-05-19: 1 [drp] via OPHTHALMIC

## 2017-05-19 MED ORDER — ARMC OPHTHALMIC DILATING DROPS
1.0000 "application " | OPHTHALMIC | Status: DC | PRN
  Administered 2017-05-19 (×3): 1 via OPHTHALMIC

## 2017-05-19 MED ORDER — LIDOCAINE HCL (PF) 2 % IJ SOLN
INTRAOCULAR | Status: DC | PRN
  Administered 2017-05-19: 1 mL via INTRAMUSCULAR

## 2017-05-19 MED ORDER — NA HYALUR & NA CHOND-NA HYALUR 0.4-0.35 ML IO KIT
PACK | INTRAOCULAR | Status: DC | PRN
  Administered 2017-05-19: 1 mL via INTRAOCULAR

## 2017-05-19 MED ORDER — FENTANYL CITRATE (PF) 100 MCG/2ML IJ SOLN
INTRAMUSCULAR | Status: DC | PRN
  Administered 2017-05-19: 50 ug via INTRAVENOUS

## 2017-05-19 MED ORDER — LACTATED RINGERS IV SOLN: 1000.0000 mL | INTRAVENOUS | Status: DC

## 2017-05-19 MED ORDER — MOXIFLOXACIN HCL 0.5 % OP SOLN
1.0000 [drp] | OPHTHALMIC | Status: DC | PRN
  Administered 2017-05-19 (×3): 1 [drp] via OPHTHALMIC

## 2017-05-19 MED ORDER — EPINEPHRINE PF 1 MG/ML IJ SOLN
INTRAMUSCULAR | Status: DC | PRN
  Administered 2017-05-19: 74 mL via OPHTHALMIC

## 2017-05-19 MED ORDER — CEFUROXIME OPHTHALMIC INJECTION 1 MG/0.1 ML
INJECTION | OPHTHALMIC | Status: DC | PRN
  Administered 2017-05-19: 0.1 mL via INTRACAMERAL

## 2017-05-19 SURGICAL SUPPLY — 25 items
CANNULA ANT/CHMB 27GA (MISCELLANEOUS) ×2 IMPLANT
CARTRIDGE ABBOTT (MISCELLANEOUS) IMPLANT
GLOVE SURG LX 7.5 STRW (GLOVE) ×1
GLOVE SURG LX STRL 7.5 STRW (GLOVE) ×1 IMPLANT
GLOVE SURG TRIUMPH 8.0 PF LTX (GLOVE) ×2 IMPLANT
GOWN STRL REUS W/ TWL LRG LVL3 (GOWN DISPOSABLE) ×2 IMPLANT
GOWN STRL REUS W/TWL LRG LVL3 (GOWN DISPOSABLE) ×2
LENS IOL TECNIS ITEC 19.5 (Intraocular Lens) ×2 IMPLANT
MARKER SKIN DUAL TIP RULER LAB (MISCELLANEOUS) ×2 IMPLANT
NDL RETROBULBAR .5 NSTRL (NEEDLE) IMPLANT
NEEDLE FILTER BLUNT 18X 1/2SAF (NEEDLE) ×1
NEEDLE FILTER BLUNT 18X1 1/2 (NEEDLE) ×1 IMPLANT
PACK CATARACT BRASINGTON (MISCELLANEOUS) ×2 IMPLANT
PACK EYE AFTER SURG (MISCELLANEOUS) ×2 IMPLANT
PACK OPTHALMIC (MISCELLANEOUS) ×2 IMPLANT
RING MALYGIN 7.0 (MISCELLANEOUS) IMPLANT
SUT ETHILON 10-0 CS-B-6CS-B-6 (SUTURE)
SUT VICRYL  9 0 (SUTURE)
SUT VICRYL 9 0 (SUTURE) IMPLANT
SUTURE EHLN 10-0 CS-B-6CS-B-6 (SUTURE) IMPLANT
SYR 3ML LL SCALE MARK (SYRINGE) ×2 IMPLANT
SYR 5ML LL (SYRINGE) ×2 IMPLANT
SYR TB 1ML LUER SLIP (SYRINGE) ×2 IMPLANT
WATER STERILE IRR 250ML POUR (IV SOLUTION) ×2 IMPLANT
WIPE NON LINTING 3.25X3.25 (MISCELLANEOUS) ×2 IMPLANT

## 2017-05-19 NOTE — Transfer of Care (Signed)
Immediate Anesthesia Transfer of Care Note  Patient: Marie Kennedy  Procedure(s) Performed: CATARACT EXTRACTION PHACO AND INTRAOCULAR LENS PLACEMENT (IOC) LEFT (Left )  Patient Location: PACU  Anesthesia Type: MAC  Level of Consciousness: awake, alert  and patient cooperative  Airway and Oxygen Therapy: Patient Spontanous Breathing and Patient connected to supplemental oxygen  Post-op Assessment: Post-op Vital signs reviewed, Patient's Cardiovascular Status Stable, Respiratory Function Stable, Patent Airway and No signs of Nausea or vomiting  Post-op Vital Signs: Reviewed and stable  Complications: No apparent anesthesia complications

## 2017-05-19 NOTE — Anesthesia Postprocedure Evaluation (Signed)
Anesthesia Post Note  Patient: Marie Kennedy  Procedure(s) Performed: CATARACT EXTRACTION PHACO AND INTRAOCULAR LENS PLACEMENT (IOC) LEFT (Left )  Patient location during evaluation: PACU Anesthesia Type: MAC Level of consciousness: awake Pain management: pain level controlled Vital Signs Assessment: post-procedure vital signs reviewed and stable Respiratory status: spontaneous breathing Cardiovascular status: blood pressure returned to baseline Postop Assessment: no headache Anesthetic complications: no    Lavonna Monarch

## 2017-05-19 NOTE — Op Note (Signed)
OPERATIVE NOTE  Marie Kennedy [id number] 05/19/2017   PREOPERATIVE DIAGNOSIS:  Nuclear sclerotic cataract left eye. H25.12   POSTOPERATIVE DIAGNOSIS:    Nuclear sclerotic cataract left eye.     PROCEDURE:  Phacoemusification with posterior chamber intraocular lens placement of the left eye   LENS:   Implant Name Type Inv. Item Serial No. Manufacturer Lot No. LRB No. Used  LENS IOL DIOP 19.5 - [id number] Intraocular Lens LENS IOL DIOP 19.5 [id number]  Left 1        ULTRASOUND TIME: 14  % of 1 minutes 26 seconds, CDE 12.4  SURGEON:  Wyonia Hough, MD   ANESTHESIA:  Topical with tetracaine drops and 2% Xylocaine jelly, augmented with 1% preservative-free intracameral lidocaine.    COMPLICATIONS:  None.   DESCRIPTION OF PROCEDURE:  The patient was identified in the holding room and transported to the operating room and placed in the supine position under the operating microscope.  The left eye was identified as the operative eye and it was prepped and draped in the usual sterile ophthalmic fashion.   A 1 millimeter clear-corneal paracentesis was made at the 1:30 position.  0.5 ml of preservative-free 1% lidocaine was injected into the anterior chamber.  The anterior chamber was filled with Viscoat viscoelastic.  A 2.4 millimeter keratome was used to make a near-clear corneal incision at the 10:30 position.  .  A curvilinear capsulorrhexis was made with a cystotome and capsulorrhexis forceps.  Balanced salt solution was used to hydrodissect and hydrodelineate the nucleus.   Phacoemulsification was then used in stop and chop fashion to remove the lens nucleus and epinucleus.  The remaining cortex was then removed using the irrigation and aspiration handpiece. Provisc was then placed into the capsular bag to distend it for lens placement.  A lens was then injected into the capsular bag.  The remaining viscoelastic was aspirated.   Wounds were hydrated with balanced salt  solution.  The anterior chamber was inflated to a physiologic pressure with balanced salt solution.  No wound leaks were noted. Cefuroxime 0.1 ml of a 10mg /ml solution was injected into the anterior chamber for a dose of 1 mg of intracameral antibiotic at the completion of the case.   Timolol and Brimonidine drops were applied to the eye.  The patient was taken to the recovery room in stable condition without complications of anesthesia or surgery.  [name] 05/19/2017, 10:20 AM

## 2017-05-19 NOTE — H&P (Signed)
The History and Physical notes are on paper, have been signed, and are to be scanned. The patient remains stable and unchanged from the H&P.   Previous H&P reviewed, patient examined, and there are no changes.  Marie Kennedy 05/19/2017 9:27 AM

## 2017-05-19 NOTE — Anesthesia Preprocedure Evaluation (Addendum)
Anesthesia Evaluation  Patient identified by MRN, date of birth, ID band  Reviewed: Allergy & Precautions, NPO status , Patient's Chart, lab work & pertinent test results, reviewed documented beta blocker date and time   Airway Mallampati: II  TM Distance: >3 FB Neck ROM: Full    Dental no notable dental hx.    Pulmonary neg pulmonary ROS,    Pulmonary exam normal breath sounds clear to auscultation       Cardiovascular hypertension, Normal cardiovascular exam Rhythm:Regular Rate:Normal     Neuro/Psych PSYCHIATRIC DISORDERS Depression negative neurological ROS     GI/Hepatic Neg liver ROS, GERD  ,  Endo/Other  negative endocrine ROS  Renal/GU negative Renal ROS  negative genitourinary   Musculoskeletal negative musculoskeletal ROS (+)   Abdominal Normal abdominal exam  (+)  Abdomen: soft.    Peds  Hematology negative hematology ROS (+)   Anesthesia Other Findings   Reproductive/Obstetrics Fibroids                            Anesthesia Physical Anesthesia Plan  ASA: II  Anesthesia Plan: MAC   Post-op Pain Management:    Induction:   PONV Risk Score and Plan:   Airway Management Planned: Nasal Cannula  Additional Equipment: None  Intra-op Plan:   Post-operative Plan:   Informed Consent: I have reviewed the patients History and Physical, chart, labs and discussed the procedure including the risks, benefits and alternatives for the proposed anesthesia with the patient or authorized representative who has indicated his/her understanding and acceptance.     Plan Discussed with: CRNA, Anesthesiologist and Surgeon  Anesthesia Plan Comments:         Anesthesia Quick Evaluation

## 2017-05-20 ENCOUNTER — Encounter: Payer: Self-pay | Admitting: Ophthalmology

## 2017-06-11 DIAGNOSIS — H2511 Age-related nuclear cataract, right eye: Secondary | ICD-10-CM | POA: Diagnosis not present

## 2017-06-12 ENCOUNTER — Other Ambulatory Visit: Payer: Self-pay

## 2017-06-12 ENCOUNTER — Encounter: Payer: Self-pay | Admitting: *Deleted

## 2017-06-17 NOTE — Discharge Instructions (Signed)

## 2017-06-18 ENCOUNTER — Encounter
Admission: RE | Disposition: A | Discharge status: Home / Self Care | Payer: Self-pay | Source: Ambulatory Visit | Attending: Ophthalmology

## 2017-06-18 ENCOUNTER — Ambulatory Visit
Admission: RE | Admit: 2017-06-18 | Discharge: 2017-06-18 | Disposition: A | Discharge status: Home / Self Care | Payer: PPO | Source: Ambulatory Visit | Attending: Ophthalmology | Admitting: Ophthalmology

## 2017-06-18 ENCOUNTER — Ambulatory Visit: Payer: PPO | Admitting: Anesthesiology

## 2017-06-18 DIAGNOSIS — M810 Age-related osteoporosis without current pathological fracture: Secondary | ICD-10-CM | POA: Diagnosis not present

## 2017-06-18 DIAGNOSIS — Z85828 Personal history of other malignant neoplasm of skin: Secondary | ICD-10-CM | POA: Diagnosis not present

## 2017-06-18 DIAGNOSIS — I10 Essential (primary) hypertension: Secondary | ICD-10-CM | POA: Diagnosis not present

## 2017-06-18 DIAGNOSIS — Z79899 Other long term (current) drug therapy: Secondary | ICD-10-CM | POA: Insufficient documentation

## 2017-06-18 DIAGNOSIS — I7300 Raynaud's syndrome without gangrene: Secondary | ICD-10-CM | POA: Insufficient documentation

## 2017-06-18 DIAGNOSIS — H2511 Age-related nuclear cataract, right eye: Secondary | ICD-10-CM | POA: Insufficient documentation

## 2017-06-18 HISTORY — PX: CATARACT EXTRACTION W/PHACO: SHX586

## 2017-06-18 SURGERY — PHACOEMULSIFICATION, CATARACT, WITH IOL INSERTION
Anesthesia: General | Laterality: Right | Wound class: Clean

## 2017-06-18 MED ORDER — ACETAMINOPHEN 160 MG/5ML PO SOLN: 325.0000 mg | ORAL | Status: DC | PRN

## 2017-06-18 MED ORDER — FENTANYL CITRATE (PF) 100 MCG/2ML IJ SOLN
INTRAMUSCULAR | Status: DC | PRN
  Administered 2017-06-18: 50 ug via INTRAVENOUS

## 2017-06-18 MED ORDER — MOXIFLOXACIN HCL 0.5 % OP SOLN
1.0000 [drp] | OPHTHALMIC | Status: DC | PRN
  Administered 2017-06-18 (×3): 1 [drp] via OPHTHALMIC

## 2017-06-18 MED ORDER — LACTATED RINGERS IV SOLN: INTRAVENOUS | Status: DC

## 2017-06-18 MED ORDER — BRIMONIDINE TARTRATE-TIMOLOL 0.2-0.5 % OP SOLN
OPHTHALMIC | Status: DC | PRN
  Administered 2017-06-18: 1 [drp] via OPHTHALMIC

## 2017-06-18 MED ORDER — ACETAMINOPHEN 325 MG PO TABS: 650.0000 mg | ORAL_TABLET | Freq: Once | ORAL | Status: DC | PRN

## 2017-06-18 MED ORDER — ONDANSETRON HCL 4 MG/2ML IJ SOLN: 4.0000 mg | Freq: Once | INTRAMUSCULAR | Status: DC | PRN

## 2017-06-18 MED ORDER — EPINEPHRINE PF 1 MG/ML IJ SOLN
INTRAMUSCULAR | Status: DC | PRN
  Administered 2017-06-18: 73 mL via OPHTHALMIC

## 2017-06-18 MED ORDER — ARMC OPHTHALMIC DILATING DROPS
1.0000 "application " | OPHTHALMIC | Status: DC | PRN
  Administered 2017-06-18 (×3): 1 via OPHTHALMIC

## 2017-06-18 MED ORDER — NA HYALUR & NA CHOND-NA HYALUR 0.4-0.35 ML IO KIT
PACK | INTRAOCULAR | Status: DC | PRN
  Administered 2017-06-18: 1 mL via INTRAOCULAR

## 2017-06-18 MED ORDER — MIDAZOLAM HCL 2 MG/2ML IJ SOLN
INTRAMUSCULAR | Status: DC | PRN
  Administered 2017-06-18: 2 mg via INTRAVENOUS

## 2017-06-18 MED ORDER — LIDOCAINE HCL (PF) 2 % IJ SOLN
INTRAOCULAR | Status: DC | PRN
  Administered 2017-06-18: 1 mL via INTRAOCULAR

## 2017-06-18 MED ORDER — CEFUROXIME OPHTHALMIC INJECTION 1 MG/0.1 ML
INJECTION | OPHTHALMIC | Status: DC | PRN
  Administered 2017-06-18: 0.1 mL via OPHTHALMIC

## 2017-06-18 SURGICAL SUPPLY — 25 items
CANNULA ANT/CHMB 27GA (MISCELLANEOUS) ×2 IMPLANT
CARTRIDGE ABBOTT (MISCELLANEOUS) IMPLANT
GLOVE SURG LX 7.5 STRW (GLOVE) ×1
GLOVE SURG LX STRL 7.5 STRW (GLOVE) ×1 IMPLANT
GLOVE SURG TRIUMPH 8.0 PF LTX (GLOVE) ×2 IMPLANT
GOWN STRL REUS W/ TWL LRG LVL3 (GOWN DISPOSABLE) ×2 IMPLANT
GOWN STRL REUS W/TWL LRG LVL3 (GOWN DISPOSABLE) ×2
LENS IOL TECNIS ITEC 20.5 (Intraocular Lens) ×2 IMPLANT
MARKER SKIN DUAL TIP RULER LAB (MISCELLANEOUS) ×2 IMPLANT
NDL RETROBULBAR .5 NSTRL (NEEDLE) IMPLANT
NEEDLE FILTER BLUNT 18X 1/2SAF (NEEDLE) ×1
NEEDLE FILTER BLUNT 18X1 1/2 (NEEDLE) ×1 IMPLANT
PACK CATARACT BRASINGTON (MISCELLANEOUS) ×2 IMPLANT
PACK EYE AFTER SURG (MISCELLANEOUS) ×2 IMPLANT
PACK OPTHALMIC (MISCELLANEOUS) ×2 IMPLANT
RING MALYGIN 7.0 (MISCELLANEOUS) ×2 IMPLANT
SUT ETHILON 10-0 CS-B-6CS-B-6 (SUTURE)
SUT VICRYL  9 0 (SUTURE)
SUT VICRYL 9 0 (SUTURE) IMPLANT
SUTURE EHLN 10-0 CS-B-6CS-B-6 (SUTURE) IMPLANT
SYR 3ML LL SCALE MARK (SYRINGE) ×2 IMPLANT
SYR 5ML LL (SYRINGE) ×2 IMPLANT
SYR TB 1ML LUER SLIP (SYRINGE) ×2 IMPLANT
WATER STERILE IRR 250ML POUR (IV SOLUTION) ×2 IMPLANT
WIPE NON LINTING 3.25X3.25 (MISCELLANEOUS) ×2 IMPLANT

## 2017-06-18 NOTE — Anesthesia Preprocedure Evaluation (Signed)
Anesthesia Evaluation  Patient identified by MRN, date of birth, ID band Patient awake    History of Anesthesia Complications Negative for: history of anesthetic complications  Airway Mallampati: I  TM Distance: >3 FB Neck ROM: Full    Dental  (+)  Crowns :   Pulmonary neg pulmonary ROS,    Pulmonary exam normal breath sounds clear to auscultation       Cardiovascular Exercise Tolerance: Good hypertension, Normal cardiovascular exam Rhythm:Regular Rate:Normal     Neuro/Psych PSYCHIATRIC DISORDERS Depression    GI/Hepatic GERD  ,IBS   Endo/Other  negative endocrine ROS  Renal/GU negative Renal ROS     Musculoskeletal   Abdominal   Peds  Hematology Raynaud's   Anesthesia Other Findings   Reproductive/Obstetrics                             Anesthesia Physical Anesthesia Plan  ASA: II  Anesthesia Plan: General   Post-op Pain Management:    Induction: Intravenous  PONV Risk Score and Plan: 2 and Midazolam  Airway Management Planned: Natural Airway  Additional Equipment:   Intra-op Plan:   Post-operative Plan:   Informed Consent: I have reviewed the patients History and Physical, chart, labs and discussed the procedure including the risks, benefits and alternatives for the proposed anesthesia with the patient or authorized representative who has indicated his/her understanding and acceptance.     Plan Discussed with: CRNA  Anesthesia Plan Comments:         Anesthesia Quick Evaluation

## 2017-06-18 NOTE — Anesthesia Procedure Notes (Signed)
Procedure Name: MAC Date/Time: 06/18/2017 9:51 AM Performed by: Janna Arch, CRNA Pre-anesthesia Checklist: Patient identified, Emergency Drugs available, Suction available and Patient being monitored Patient Re-evaluated:Patient Re-evaluated prior to induction Oxygen Delivery Method: Nasal cannula

## 2017-06-18 NOTE — Anesthesia Postprocedure Evaluation (Signed)
Anesthesia Post Note  Patient: Marie Kennedy  Procedure(s) Performed: CATARACT EXTRACTION PHACO AND INTRAOCULAR LENS PLACEMENT (IOC)  COMPLICATED  RIGHT (Right )  Patient location during evaluation: PACU Anesthesia Type: General Level of consciousness: awake and alert, oriented and patient cooperative Pain management: pain level controlled Vital Signs Assessment: post-procedure vital signs reviewed and stable Respiratory status: spontaneous breathing, nonlabored ventilation and respiratory function stable Cardiovascular status: blood pressure returned to baseline and stable Postop Assessment: adequate PO intake Anesthetic complications: no    Darrin Nipper

## 2017-06-18 NOTE — Transfer of Care (Signed)
Immediate Anesthesia Transfer of Care Note  Patient: Marie Kennedy  Procedure(s) Performed: CATARACT EXTRACTION PHACO AND INTRAOCULAR LENS PLACEMENT (IOC)  COMPLICATED  RIGHT (Right )  Patient Location: PACU  Anesthesia Type: General  Level of Consciousness: awake, alert  and patient cooperative  Airway and Oxygen Therapy: Patient Spontanous Breathing and Patient connected to supplemental oxygen  Post-op Assessment: Post-op Vital signs reviewed, Patient's Cardiovascular Status Stable, Respiratory Function Stable, Patent Airway and No signs of Nausea or vomiting  Post-op Vital Signs: Reviewed and stable  Complications: No apparent anesthesia complications

## 2017-06-18 NOTE — H&P (Signed)
The History and Physical notes are on paper, have been signed, and are to be scanned. The patient remains stable and unchanged from the H&P.   Previous H&P reviewed, patient examined, and there are no changes.  Nancyann Cotterman 06/18/2017 8:45 AM

## 2017-06-18 NOTE — Op Note (Signed)
OPERATIVE NOTE  Marie Kennedy [id number] 06/18/2017   PREOPERATIVE DIAGNOSIS:    Nuclear Sclerotic Cataract Right eye with miotic pupil.        H25.11  POSTOPERATIVE DIAGNOSIS: Nuclear Sclerotic Cataract Right eye with miotic pupil.          PROCEDURE:  Phacoemusification with posterior chamber intraocular lens placement of the right eye which required pupil stretching with the Malyugin pupil expansion device.  LENS:   Implant Name Type Inv. Item Serial No. Manufacturer Lot No. LRB No. Used  LENS IOL DIOP 20.5 - [id number] Intraocular Lens LENS IOL DIOP 20.5 [id number]  Right 1       ULTRASOUND TIME: 14 % of 1 minutes 27 seconds, CDE 12.3  SURGEON:  Wyonia Hough, MD   ANESTHESIA:  Topical with tetracaine drops and 2% Xylocaine jelly, augmented with 1% preservative-free intracameral lidocaine.   COMPLICATIONS:  None.   DESCRIPTION OF PROCEDURE:  The patient was identified in the holding room and transported to the operating room and placed in the supine position under the operating microscope. Theright eye was identified as the operative eye and it was prepped and draped in the usual sterile ophthalmic fashion.   A 1 millimeter clear-corneal paracentesis was made at the 12:00 position.  0.5 ml of preservative-free 1% lidocaine was injected into the anterior chamber. The anterior chamber was filled with Viscoat viscoelastic.  A 2.4 millimeter keratome was used to make a near-clear corneal incision at the 9:00 position. A Malyugin pupil expander was then placed through the main incision and into the anterior chamber of the eye.  The edge of the iris was secured on the lip of the pupil expander and it was released, thereby expanding the pupil to approximately 8 millimeters for completion of the cataract surgery.  Additional Viscoat was placed in the anterior chamber.  A cystotome and capsulorrhexis forceps were used to make a curvilinear capsulorrhexis.   Balanced salt  solution was used to hydrodissect and hydrodelineate the lens nucleus.   Phacoemulsification was used in stop and chop fashion to remove the lens, nucleus and epinucleus.  The remaining cortex was aspirated using the irrigation aspiration handpiece.  Additional Provisc was placed into the eye to distend the capsular bag for lens placement.  A lens was then injected into the capsular bag.  The pupil expanding ring was removed using a Kuglen hook and insertion device. The remaining viscoelastic was aspirated from the capsular bag and the anterior chamber.  The anterior chamber was filled with balanced salt solution to inflate to a physiologic pressure.  Wounds were hydrated with balanced salt solution.  The anterior chamber was inflated to a physiologic pressure with balanced salt solution.  No wound leaks were noted.Cefuroxime 0.1 ml of a 10mg /ml solution was injected into the anterior chamber for a dose of 1 mg of intracameral antibiotic at the completion of the case. Timolol and Brimonidine drops were applied to the eye.  The patient was taken to the recovery room in stable condition without complications of anesthesia or surgery.  [name] 06/18/2017, 10:12 AM

## 2017-06-19 ENCOUNTER — Encounter: Payer: Self-pay | Admitting: Ophthalmology

## 2017-08-25 DIAGNOSIS — L578 Other skin changes due to chronic exposure to nonionizing radiation: Secondary | ICD-10-CM | POA: Diagnosis not present

## 2017-08-25 DIAGNOSIS — D229 Melanocytic nevi, unspecified: Secondary | ICD-10-CM | POA: Diagnosis not present

## 2017-08-25 DIAGNOSIS — L719 Rosacea, unspecified: Secondary | ICD-10-CM | POA: Diagnosis not present

## 2017-08-25 DIAGNOSIS — Z85828 Personal history of other malignant neoplasm of skin: Secondary | ICD-10-CM | POA: Diagnosis not present

## 2017-08-25 DIAGNOSIS — L820 Inflamed seborrheic keratosis: Secondary | ICD-10-CM | POA: Diagnosis not present

## 2017-08-25 DIAGNOSIS — D1800 Hemangioma unspecified site: Secondary | ICD-10-CM | POA: Diagnosis not present

## 2017-08-25 DIAGNOSIS — L821 Other seborrheic keratosis: Secondary | ICD-10-CM | POA: Diagnosis not present

## 2017-08-25 DIAGNOSIS — L812 Freckles: Secondary | ICD-10-CM | POA: Diagnosis not present

## 2017-08-25 DIAGNOSIS — Z1283 Encounter for screening for malignant neoplasm of skin: Secondary | ICD-10-CM | POA: Diagnosis not present

## 2017-12-08 ENCOUNTER — Telehealth: Payer: Self-pay | Admitting: Family Medicine

## 2017-12-08 DIAGNOSIS — I10 Essential (primary) hypertension: Secondary | ICD-10-CM

## 2017-12-08 DIAGNOSIS — M810 Age-related osteoporosis without current pathological fracture: Secondary | ICD-10-CM

## 2017-12-08 NOTE — Telephone Encounter (Signed)
-----   Message from Eustace Pen, LPN sent at 07/09/9288  4:27 PM EDT ----- Regarding: Labs 6/5 Lab orders needed. Thank you.  Insurance:  [employer]

## 2017-12-10 ENCOUNTER — Ambulatory Visit: Payer: PPO

## 2017-12-10 ENCOUNTER — Ambulatory Visit (INDEPENDENT_AMBULATORY_CARE_PROVIDER_SITE_OTHER): Payer: PPO

## 2017-12-10 VITALS — BP 104/60 | HR 66 | Temp 98.3°F | Ht 62.0 in | Wt 121.0 lb

## 2017-12-10 DIAGNOSIS — M810 Age-related osteoporosis without current pathological fracture: Secondary | ICD-10-CM

## 2017-12-10 DIAGNOSIS — I10 Essential (primary) hypertension: Secondary | ICD-10-CM | POA: Diagnosis not present

## 2017-12-10 DIAGNOSIS — Z0000 Encounter for general adult medical examination without abnormal findings: Secondary | ICD-10-CM

## 2017-12-10 LAB — LIPID PANEL
CHOL/HDL RATIO: 2
Cholesterol: 179 mg/dL (ref 0–200)
HDL: 79.6 mg/dL (ref >39.00)
LDL CALC: 89 mg/dL (ref 0–99)
NONHDL: 99.11
Triglycerides: 50 mg/dL (ref 0.0–149.0)
VLDL: 10 mg/dL (ref 0.0–40.0)

## 2017-12-10 LAB — CBC WITH DIFFERENTIAL/PLATELET
BASOS ABS: 0.1 10*3/uL (ref 0.0–0.1)
Basophils Relative: 1.3 % (ref 0.0–3.0)
EOS ABS: 0.2 10*3/uL (ref 0.0–0.7)
Eosinophils Relative: 5.8 % — ABNORMAL HIGH (ref 0.0–5.0)
HEMATOCRIT: 40.8 % (ref 36.0–46.0)
Hemoglobin: 13.7 g/dL (ref 12.0–15.0)
LYMPHS ABS: 1.4 10*3/uL (ref 0.7–4.0)
LYMPHS PCT: 32.8 % (ref 12.0–46.0)
MCHC: 33.6 g/dL (ref 30.0–36.0)
MCV: 91.9 fl (ref 78.0–100.0)
MONOS PCT: 11.5 % (ref 3.0–12.0)
Monocytes Absolute: 0.5 10*3/uL (ref 0.1–1.0)
NEUTROS ABS: 2 10*3/uL (ref 1.4–7.7)
NEUTROS PCT: 48.6 % (ref 43.0–77.0)
PLATELETS: 209 10*3/uL (ref 150.0–400.0)
RBC: 4.44 Mil/uL (ref 3.87–5.11)
RDW: 13.5 % (ref 11.5–15.5)
WBC: 4.2 10*3/uL (ref 4.0–10.5)

## 2017-12-10 LAB — COMPREHENSIVE METABOLIC PANEL
ALT: 16 U/L (ref 0–35)
AST: 17 U/L (ref 0–37)
Albumin: 4.2 g/dL (ref 3.5–5.2)
Alkaline Phosphatase: 69 U/L (ref 39–117)
BILIRUBIN TOTAL: 1.2 mg/dL (ref 0.2–1.2)
BUN: 18 mg/dL (ref 6–23)
CHLORIDE: 103 meq/L (ref 96–112)
CO2: 32 meq/L (ref 19–32)
CREATININE: 0.9 mg/dL (ref 0.40–1.20)
Calcium: 10 mg/dL (ref 8.4–10.5)
GFR: 65.41 mL/min (ref >60.00)
GLUCOSE: 89 mg/dL (ref 70–99)
Potassium: 4.6 mEq/L (ref 3.5–5.1)
Sodium: 139 mEq/L (ref 135–145)
Total Protein: 6.9 g/dL (ref 6.0–8.3)

## 2017-12-10 LAB — VITAMIN D 25 HYDROXY (VIT D DEFICIENCY, FRACTURES): VITD: 17.45 ng/mL — AB (ref 30.00–100.00)

## 2017-12-10 LAB — TSH: TSH: 1.79 u[IU]/mL (ref 0.35–4.50)

## 2017-12-10 NOTE — Progress Notes (Signed)
Subjective:   Marie Kennedy is a 72 y.o. female who presents for Medicare Annual (Subsequent) preventive examination.  Review of Systems:  N/A Cardiac Risk Factors include: advanced age (>26men, >31 women);hypertension     Objective:     Vitals: BP 104/60 (BP Location: Right Arm, Patient Position: Sitting, Cuff Size: Normal)   Pulse 66   Temp 98.3 F (36.8 C) (Oral)   Ht 5\' 2"  (1.575 m) Comment: no shoes  Wt 121 lb (54.9 kg)   SpO2 96%   BMI 22.13 kg/m   Body mass index is 22.13 kg/m.  Advanced Directives 12/10/2017 06/18/2017 05/19/2017 12/04/2016 11/29/2015 11/10/2013 06/14/2011  Does Patient Have a Medical Advance Directive? No No No No No Patient does not have advance directive Patient does not have advance directive  Would patient like information on creating a medical advance directive? No - Patient declined No - Patient declined No - Patient declined No - Patient declined Yes - Scientist, clinical (histocompatibility and immunogenetics) given - -    Tobacco Social History   Tobacco Use  Smoking Status Never Smoker  Smokeless Tobacco Never Used     Counseling given: No   Clinical Intake:  Pre-visit preparation completed: Yes  Pain : No/denies pain Pain Score: 0-No pain     Nutritional Status: BMI 25 -29 Overweight Nutritional Risks: None Diabetes: No  How often do you need to have someone help you when you read instructions, pamphlets, or other written materials from your doctor or pharmacy?: 1 - Never  Interpreter Needed?: No  Comments: pt lives with spouse Information entered by :: LPinson, LPN  Past Medical History:  Diagnosis Date  . Allergy   . Basal cell adenoma 11/14   face  . Cancer ([place])    skin  . Fibroids 1993   cysts endometriosis  . GERD (gastroesophageal reflux disease)   . Hypertension    PCP   Dr Glori Bickers at [facility]  . IBS (irritable bowel syndrome)   . Rosacea    Past Surgical History:  Procedure Laterality Date  . ABDOMINAL HYSTERECTOMY  1993  .  BASAL CELL CARCINOMA EXCISION  11/14   face  . BREAST EXCISIONAL BIOPSY Right 1970   Negative  . BREAST SURGERY Right 1970's   lumpectomy no cancer  . CATARACT EXTRACTION W/PHACO Left 05/19/2017   Procedure: CATARACT EXTRACTION PHACO AND INTRAOCULAR LENS PLACEMENT ([place]) LEFT;  Surgeon: Leandrew Koyanagi, MD;  Location: [place];  Service: Ophthalmology;  Laterality: Left;  . CATARACT EXTRACTION W/PHACO Right 06/18/2017   Procedure: CATARACT EXTRACTION PHACO AND INTRAOCULAR LENS PLACEMENT ([place])  COMPLICATED  RIGHT;  Surgeon: Leandrew Koyanagi, MD;  Location: [place];  Service: Ophthalmology;  Laterality: Right;  . CHOLECYSTECTOMY  06/18/2011   Procedure: LAPAROSCOPIC CHOLECYSTECTOMY WITH INTRAOPERATIVE CHOLANGIOGRAM;  Surgeon: Joyice Faster. Cornett, MD;  Location: [facility];  Service: General;  Laterality: N/A;  lap chole with IOC  . TONSILLECTOMY  1953   Family History  Problem Relation Age of Onset  . Hypertension Mother   . Cancer Mother        breast, uterine, and cervical  . Breast cancer Mother 42  . Heart disease Father        CAD  . Hypertension Father   . Diabetes Father        TYPE II  . Cancer Father        brain  . Colon cancer Neg Hx    Social History   Socioeconomic History  .  Marital status: Married    Spouse name: Not on file  . Number of children: Not on file  . Years of education: Not on file  . Highest education level: Not on file  Occupational History  . Not on file  Social Needs  . Financial resource strain: Not on file  . Food insecurity:    Worry: Not on file    Inability: Not on file  . Transportation needs:    Medical: Not on file    Non-medical: Not on file  Tobacco Use  . Smoking status: Never Smoker  . Smokeless tobacco: Never Used  Substance and Sexual Activity  . Alcohol use: No    Alcohol/week: 0.0 oz  . Drug use: No  . Sexual activity: Yes  Lifestyle  . Physical activity:    Days per week: Not on file     Minutes per session: Not on file  . Stress: Not on file  Relationships  . Social connections:    Talks on phone: Not on file    Gets together: Not on file    Attends religious service: Not on file    Active member of club or organization: Not on file    Attends meetings of clubs or organizations: Not on file    Relationship status: Not on file  Other Topics Concern  . Not on file  Social History Narrative  . Not on file    Outpatient Encounter Medications as of 12/10/2017  Medication Sig  . Calcium Carbonate-Vit D-Min 600-400 MG-UNIT TABS Take 1 tablet by mouth daily.   . DiphenhydrAMINE HCl (ALLERGY MED PO) Take 25 mg by mouth daily as needed. Allergies   . hyoscyamine (LEVSIN SL) 0.125 MG SL tablet Place 1 tablet (0.125 mg total) under the tongue 2 (two) times daily as needed for cramping. Abdominal cramping  . lisinopril-hydrochlorothiazide (PRINZIDE,ZESTORETIC) 10-12.5 MG tablet Take 0.5 tablets by mouth every morning.  . metroNIDAZOLE (METROCREAM) 0.75 % cream Apply 1 application topically daily. Rosacea  . Multiple Vitamin (MULTIVITAMIN) tablet Take 1 tablet by mouth daily.    No facility-administered encounter medications on file as of 12/10/2017.     Activities of Daily Living In your present state of health, do you have any difficulty performing the following activities: 12/10/2017 06/18/2017  Hearing? N N  Vision? N N  Difficulty concentrating or making decisions? N N  Walking or climbing stairs? N N  Dressing or bathing? N N  Doing errands, shopping? N -  Preparing Food and eating ? N -  Using the Toilet? N -  In the past six months, have you accidently leaked urine? N -  Do you have problems with loss of bowel control? N -  Managing your Medications? N -  Managing your Finances? N -  Housekeeping or managing your Housekeeping? N -  Some recent data might be hidden    Patient Care Team: Tower, Wynelle Fanny, MD as PCP - General Ralene Bathe, MD as Referring  Physician (Dermatology) Karren Burly Deirdre Peer, MD as Referring Physician (Ophthalmology) Karsten Fells, DDS as Referring Physician (Dentistry)    Assessment:   This is a routine wellness examination for Marie Kennedy.   Hearing Screening   125Hz  250Hz  500Hz  1000Hz  2000Hz  3000Hz  4000Hz  6000Hz  8000Hz   Right ear:   40 40 40  40    Left ear:   0 40 40  0    Vision Screening Comments: Vision exam Jan 2019 with Dr. Wallace Going    Exercise Activities  and Dietary recommendations Current Exercise Habits: Home exercise routine, Type of exercise: walking, Time (Minutes): 60, Frequency (Times/Week): 7, Weekly Exercise (Minutes/Week): 420, Intensity: Moderate, Exercise limited by: None identified  Goals    . Increase physical activity     Starting 12/10/2017, I will continue to walk at least 60 minutes daily.        Fall Risk Fall Risk  12/10/2017 12/04/2016 11/29/2015 09/20/2013 06/22/2012  Falls in the past year? No No No No No   Depression Screen PHQ 2/9 Scores 12/10/2017 12/04/2016 11/29/2015 06/22/2012  PHQ - 2 Score 0 0 0 0  PHQ- 9 Score 0 - - -     Cognitive Function MMSE - Mini Mental State Exam 12/10/2017 12/04/2016 11/29/2015  Orientation to time 5 5 5   Orientation to Place 5 5 5   Registration 3 3 3   Attention/ Calculation 0 0 0  Recall 3 3 3   Language- name 2 objects 0 0 0  Language- repeat 1 1 1   Language- follow 3 step command 3 3 3   Language- read & follow direction 0 0 0  Write a sentence 0 0 0  Copy design 0 0 0  Total score 20 20 20      PLEASE NOTE: A Mini-Cog screen was completed. Maximum score is 20. A value of 0 denotes this part of [place] MMSE was not completed or the patient failed this part of the Mini-Cog screening.   Mini-Cog Screening Orientation to Time - Max 5 pts Orientation to Place - Max 5 pts Registration - Max 3 pts Recall - Max 3 pts Language Repeat - Max 1 pts Language Follow 3 Step Command - Max 3 pts     Immunization History  Administered Date(s)  Administered  . Influenza Whole 04/30/2006  . Influenza,inj,Quad PF,6+ Mos 04/22/2014, 05/23/2015, 04/15/2016, 05/01/2017  . Pneumococcal Conjugate-13 11/16/2014  . Pneumococcal Polysaccharide-23 12/12/2010  . Td 07/20/2002, 06/22/2012  . Zoster 07/03/2012    Screening Tests Health Maintenance  Topic Date Due  . MAMMOGRAM  12/23/2017  . INFLUENZA VACCINE  02/05/2018  . COLONOSCOPY  12/04/2018  . TETANUS/TDAP  06/22/2022  . DEXA SCAN  Completed  . Hepatitis C Screening  Completed  . PNA vac Low Risk Adult  Completed       Plan:     I have personally reviewed, addressed, and noted the following in the patient's chart:  A. Medical and social history B. Use of alcohol, tobacco or illicit drugs  C. Current medications and supplements D. Functional ability and status E.  Nutritional status F.  Physical activity G. Advance directives H. List of other physicians I.  Hospitalizations, surgeries, and ER visits in previous 12 months J.  [place] to include hearing, vision, cognitive, depression L. Referrals and appointments - none  In addition, I have reviewed and discussed with patient certain preventive protocols, quality metrics, and best practice recommendations. A written personalized care plan for preventive services as well as general preventive health recommendations were provided to patient.  See attached scanned questionnaire for additional information.   Signed,   Lindell Noe, MHA, BS, LPN Health Coach

## 2017-12-10 NOTE — Progress Notes (Signed)
PCP notes:   Health maintenance:  No gaps identified.  Abnormal screenings:   Hearing - failed  Hearing Screening   125Hz  250Hz  500Hz  1000Hz  2000Hz  3000Hz  4000Hz  6000Hz  8000Hz   Right ear:   40 40 40  40    Left ear:   0 40 40  0     Patient concerns:   None  Nurse concerns:  None  Next PCP appt:   12/17/2017 @ 1430

## 2017-12-10 NOTE — Patient Instructions (Signed)
Marie Kennedy , Thank you for taking time to come for your Medicare Wellness Visit. I appreciate your ongoing commitment to your health goals. Please review the following plan we discussed and let me know if I can assist you in the future.   These are the goals we discussed: Goals    . Increase physical activity     Starting 12/10/2017, I will continue to walk at least 60 minutes daily.        This is a list of the screening recommended for you and due dates:  Health Maintenance  Topic Date Due  . Mammogram  12/23/2017  . Flu Shot  02/05/2018  . Colon Cancer Screening  12/04/2018  . Tetanus Vaccine  06/22/2022  . DEXA scan (bone density measurement)  Completed  .  Hepatitis C: One time screening is recommended by Center for Disease Control  (CDC) for  adults born from [birth year] through 1965.   Completed  . Pneumonia vaccines  Completed   Preventive Care for Adults  A healthy lifestyle and preventive care can promote health and wellness. Preventive health guidelines for adults include the following key practices.  . A routine yearly physical is a good way to check with your health care provider about your health and preventive screening. It is a chance to share any concerns and updates on your health and to receive a thorough exam.  . Visit your dentist for a routine exam and preventive care every 6 months. Brush your teeth twice a day and floss once a day. Good oral hygiene prevents tooth decay and gum disease.  . The frequency of eye exams is based on your age, health, family medical history, use  of contact lenses, and other factors. Follow your health care provider's recommendations for frequency of eye exams.  . Eat a healthy diet. Foods like vegetables, fruits, whole grains, low-fat dairy products, and lean protein foods contain the nutrients you need without too many calories. Decrease your intake of foods high in solid fats, added sugars, and salt. Eat the right amount of calories for  you. Get information about a proper diet from your health care provider, if necessary.  . Regular physical exercise is one of the most important things you can do for your health. Most adults should get at least 150 minutes of moderate-intensity exercise (any activity that increases your heart rate and causes you to sweat) each week. In addition, most adults need muscle-strengthening exercises on 2 or more days a week.  Silver Sneakers may be a benefit available to you. To determine eligibility, you may visit the website: www.silversneakers.com or contact program at [phone]) [phone] Mon-Fri between 8AM-8PM.   . Maintain a healthy weight. The body mass index (BMI) is a screening tool to identify possible weight problems. It provides an estimate of body fat based on height and weight. Your health care provider can find your BMI and can help you achieve or maintain a healthy weight.   For adults 20 years and older: ? A BMI below 18.5 is considered underweight. ? A BMI of 18.5 to 24.9 is normal. ? A BMI of 25 to 29.9 is considered overweight. ? A BMI of 30 and above is considered obese.   . Maintain normal blood lipids and cholesterol levels by exercising and minimizing your intake of saturated fat. Eat a balanced diet with plenty of fruit and vegetables. Blood tests for lipids and cholesterol should begin at age 74 and be repeated every 5 years. If your  lipid or cholesterol levels are high, you are over 50, or you are at high risk for heart disease, you may need your cholesterol levels checked more frequently. Ongoing high lipid and cholesterol levels should be treated with medicines if diet and exercise are not working.  . If you smoke, find out from your health care provider how to quit. If you do not use tobacco, please do not start.  . If you choose to drink alcohol, please do not consume more than 2 drinks per day. One drink is considered to be 12 ounces (355 mL) of beer, 5 ounces (148 mL) of  wine, or 1.5 ounces (44 mL) of liquor.  . If you are 45-52 years old, ask your health care provider if you should take aspirin to prevent strokes.  . Use sunscreen. Apply sunscreen liberally and repeatedly throughout the day. You should seek shade when your shadow is shorter than you. Protect yourself by wearing long sleeves, pants, a wide-brimmed hat, and sunglasses year round, whenever you are outdoors.  . Once a month, do a whole body skin exam, using a mirror to look at the skin on your back. Tell your health care provider of new moles, moles that have irregular borders, moles that are larger than a pencil eraser, or moles that have changed in shape or color.

## 2017-12-10 NOTE — Progress Notes (Signed)
I reviewed health advisor's note, was available for consultation, and agree with documentation and plan.   Signed,  Jewett Mcgann T. Melvenia Favela, MD  

## 2017-12-17 ENCOUNTER — Ambulatory Visit (INDEPENDENT_AMBULATORY_CARE_PROVIDER_SITE_OTHER): Payer: PPO | Admitting: Family Medicine

## 2017-12-17 ENCOUNTER — Encounter: Payer: Self-pay | Admitting: Family Medicine

## 2017-12-17 VITALS — BP 130/76 | HR 67 | Temp 97.9°F | Ht 62.0 in | Wt 123.0 lb

## 2017-12-17 DIAGNOSIS — E2839 Other primary ovarian failure: Secondary | ICD-10-CM | POA: Diagnosis not present

## 2017-12-17 DIAGNOSIS — M810 Age-related osteoporosis without current pathological fracture: Secondary | ICD-10-CM

## 2017-12-17 DIAGNOSIS — Z0000 Encounter for general adult medical examination without abnormal findings: Secondary | ICD-10-CM

## 2017-12-17 DIAGNOSIS — I10 Essential (primary) hypertension: Secondary | ICD-10-CM

## 2017-12-17 MED ORDER — ERGOCALCIFEROL 1.25 MG (50000 UT) PO CAPS: 50000.0000 [IU] | ORAL_CAPSULE | ORAL | 0 refills | Status: DC

## 2017-12-17 MED ORDER — LISINOPRIL-HYDROCHLOROTHIAZIDE 10-12.5 MG PO TABS: 0.5000 | ORAL_TABLET | ORAL | 11 refills | Status: DC

## 2017-12-17 MED ORDER — HYOSCYAMINE SULFATE 0.125 MG SL SUBL: 0.1250 mg | SUBLINGUAL_TABLET | Freq: Two times a day (BID) | SUBLINGUAL | 11 refills | Status: DC | PRN

## 2017-12-17 NOTE — Patient Instructions (Addendum)
Don't forget to schedule a mammogram appointment   Your D level is low  Take ergocalciferol weekly for 12 weeks Take vitamin D3 over the counter 4000 iu daily from now on  Also continue the calcium supplement with D   We will schedule bone density test on the way out   Use ice on leg for shin splint pain - before and after exercise

## 2017-12-17 NOTE — Progress Notes (Signed)
Subjective:    Patient ID: Marie Kennedy, female    DOB: 10-Aug-1945, 72 y.o.   MRN: [id number]  HPI Here for health maintenance exam and to review chronic medical problems    Doing well overall  Not a busy summer   Some shin splint symptoms in R leg  Chronic ankle problems  Better shoes have helped    Wt Readings from Last 3 Encounters:  12/17/17 123 lb (55.8 kg)  12/10/17 121 lb (54.9 kg)  06/18/17 122 lb (55.3 kg)  still walking for exercise  Also eats healthy most of the time  22.50 kg/m   amw was 6/5 Missed high and low Hz on L ear hearing screen Not bothering her   Mammogram 6/18 nl- does not have it scheduled yet/ [facility]  Self breast exam - no lumps  Mother had breast and uterine/cervical ca  Hysterectomy in 1990s  Colonoscopy 5/15- adenoma 5 y recall   dexa 6/17- OP with slt dec bmd at [facility] No falls No fx  Ca and D-taking  D level is low - 17.4    zostavax 12/13  bp is stable today  No cp or palpitations or headaches or edema  No side effects to medicines  BP Readings from Last 3 Encounters:  12/17/17 130/76  12/10/17 104/60  06/18/17 124/65     Her cuff is running high  May be time for a new meter   Cholesterol  Lab Results  Component Value Date   CHOL 179 12/10/2017   CHOL 185 12/04/2016   CHOL 187 11/22/2015   Lab Results  Component Value Date   HDL 79.60 12/10/2017   HDL 84.60 12/04/2016   HDL 76.70 11/22/2015   Lab Results  Component Value Date   LDLCALC 89 12/10/2017   [place] 85 12/04/2016   LDLCALC 96 11/22/2015   Lab Results  Component Value Date   TRIG 50.0 12/10/2017   TRIG 75.0 12/04/2016   TRIG 75.0 11/22/2015   Lab Results  Component Value Date   CHOLHDL 2 12/10/2017   CHOLHDL 2 12/04/2016   CHOLHDL 2 11/22/2015   No results found for: LDLDIRECT No cholesterol problems  Eats a healthy diet   Other labs Results for orders placed or performed in visit on 12/10/17  VITAMIN D 25 Hydroxy (Vit-D Deficiency,  Fractures)  Result Value Ref Range   VITD 17.45 (L) 30.00 - 100.00 ng/mL  TSH  Result Value Ref Range   TSH 1.79 0.35 - 4.50 uIU/mL  Lipid panel  Result Value Ref Range   Cholesterol 179 0 - 200 mg/dL   Triglycerides 50.0 0.0 - 149.0 mg/dL   HDL 79.60 >39.00 mg/dL   VLDL 10.0 0.0 - 40.0 mg/dL   LDL Cholesterol 89 0 - 99 mg/dL   Total CHOL/HDL Ratio 2    NonHDL 99.11   Comprehensive metabolic panel  Result Value Ref Range   Sodium 139 135 - 145 mEq/L   Potassium 4.6 3.5 - 5.1 mEq/L   Chloride 103 96 - 112 mEq/L   CO2 32 19 - 32 mEq/L   Glucose, Bld 89 70 - 99 mg/dL   BUN 18 6 - 23 mg/dL   Creatinine, Ser 0.90 0.40 - 1.20 mg/dL   Total Bilirubin 1.2 0.2 - 1.2 mg/dL   Alkaline Phosphatase 69 39 - 117 U/L   AST 17 0 - 37 U/L   ALT 16 0 - 35 U/L   Total Protein 6.9 6.0 - 8.3 g/dL  Albumin 4.2 3.5 - 5.2 g/dL   Calcium 10.0 8.4 - 10.5 mg/dL   GFR 65.41 >60.00 mL/min  CBC with Differential/Platelet  Result Value Ref Range   WBC 4.2 4.0 - 10.5 K/uL   RBC 4.44 3.87 - 5.11 Mil/uL   Hemoglobin 13.7 12.0 - 15.0 g/dL   HCT 40.8 36.0 - 46.0 %   MCV 91.9 78.0 - 100.0 fl   MCHC 33.6 30.0 - 36.0 g/dL   RDW 13.5 11.5 - 15.5 %   Platelets 209.0 150.0 - 400.0 K/uL   Neutrophils Relative % 48.6 43.0 - 77.0 %   Lymphocytes Relative 32.8 12.0 - 46.0 %   Monocytes Relative 11.5 3.0 - 12.0 %   Eosinophils Relative 5.8 (H) 0.0 - 5.0 %   Basophils Relative 1.3 0.0 - 3.0 %   Neutro Abs 2.0 1.4 - 7.7 K/uL   Lymphs Abs 1.4 0.7 - 4.0 K/uL   Monocytes Absolute 0.5 0.1 - 1.0 K/uL   Eosinophils Absolute 0.2 0.0 - 0.7 K/uL   Basophils Absolute 0.1 0.0 - 0.1 K/uL     Patient Active Problem List   Diagnosis Date Noted  . Routine general medical examination at a health care facility 11/22/2015  . Estrogen deficiency 11/22/2015  . Exposure to communicable disease 11/22/2015  . Colon cancer screening 06/22/2012  . Hyperbilirubinemia 06/22/2012  . Osteoporosis 12/12/2010  . Post-menopausal  12/12/2010  . Other screening mammogram 12/04/2010  . ALLERGIC RHINITIS, SEASONAL 11/13/2009  . DEPRESSION 05/04/2007  . Essential hypertension 05/04/2007  . IBS 05/04/2007  . ROSACEA 05/04/2007   Past Medical History:  Diagnosis Date  . Allergy   . Basal cell adenoma 11/14   face  . Cancer ([place])    skin  . Fibroids 1993   cysts endometriosis  . GERD (gastroesophageal reflux disease)   . Hypertension    PCP   Dr Glori Bickers at [facility]  . IBS (irritable bowel syndrome)   . Rosacea    Past Surgical History:  Procedure Laterality Date  . ABDOMINAL HYSTERECTOMY  1993  . BASAL CELL CARCINOMA EXCISION  11/14   face  . BREAST EXCISIONAL BIOPSY Right 1970   Negative  . BREAST SURGERY Right 1970's   lumpectomy no cancer  . CATARACT EXTRACTION W/PHACO Left 05/19/2017   Procedure: CATARACT EXTRACTION PHACO AND INTRAOCULAR LENS PLACEMENT ([place]) LEFT;  Surgeon: Leandrew Koyanagi, MD;  Location: [place];  Service: Ophthalmology;  Laterality: Left;  . CATARACT EXTRACTION W/PHACO Right 06/18/2017   Procedure: CATARACT EXTRACTION PHACO AND INTRAOCULAR LENS PLACEMENT ([place])  COMPLICATED  RIGHT;  Surgeon: Leandrew Koyanagi, MD;  Location: [place];  Service: Ophthalmology;  Laterality: Right;  . CHOLECYSTECTOMY  06/18/2011   Procedure: LAPAROSCOPIC CHOLECYSTECTOMY WITH INTRAOPERATIVE CHOLANGIOGRAM;  Surgeon: Joyice Faster. Cornett, MD;  Location: [facility];  Service: General;  Laterality: N/A;  lap chole with IOC  . TONSILLECTOMY  1953   Social History   Tobacco Use  . Smoking status: Never Smoker  . Smokeless tobacco: Never Used  Substance Use Topics  . Alcohol use: No    Alcohol/week: 0.0 oz  . Drug use: No   Family History  Problem Relation Age of Onset  . Hypertension Mother   . Cancer Mother        breast, uterine, and cervical  . Breast cancer Mother 81  . Heart disease Father        CAD  . Hypertension Father   . Diabetes Father  TYPE II   . Cancer Father        brain  . Colon cancer Neg Hx    No Known Allergies Current Outpatient Medications on File Prior to Visit  Medication Sig Dispense Refill  . Calcium Carbonate-Vit D-Min 600-400 MG-UNIT TABS Take 1 tablet by mouth daily.     . DiphenhydrAMINE HCl (ALLERGY MED PO) Take 25 mg by mouth daily as needed. Allergies     . metroNIDAZOLE (METROCREAM) 0.75 % cream Apply 1 application topically daily. Rosacea    . Multiple Vitamin (MULTIVITAMIN) tablet Take 1 tablet by mouth daily.      No current facility-administered medications on file prior to visit.     Review of Systems  Constitutional: Negative for activity change, appetite change, fatigue, fever and unexpected weight change.  HENT: Negative for congestion, ear pain, rhinorrhea, sinus pressure and sore throat.   Eyes: Negative for pain, redness and visual disturbance.  Respiratory: Negative for cough, shortness of breath and wheezing.   Cardiovascular: Negative for chest pain and palpitations.  Gastrointestinal: Negative for abdominal pain, blood in stool, constipation and diarrhea.  Endocrine: Negative for polydipsia and polyuria.  Genitourinary: Negative for dysuria, frequency and urgency.  Musculoskeletal: Negative for arthralgias, back pain and myalgias.  Skin: Negative for pallor and rash.  Allergic/Immunologic: Negative for environmental allergies.  Neurological: Negative for dizziness, syncope and headaches.  Hematological: Negative for adenopathy. Does not bruise/bleed easily.  Psychiatric/Behavioral: Negative for decreased concentration and dysphoric mood. The patient is not nervous/anxious.        Objective:   Physical Exam  Constitutional: She appears well-developed and well-nourished. No distress.  Well appearing   HENT:  Head: Normocephalic and atraumatic.  Right Ear: External ear normal.  Left Ear: External ear normal.  Mouth/Throat: Oropharynx is clear and moist.  Eyes: Pupils are equal,  round, and reactive to light. Conjunctivae and EOM are normal. No scleral icterus.  Neck: Normal range of motion. Neck supple. No JVD present. Carotid bruit is not present. No thyromegaly present.  Cardiovascular: Normal rate, regular rhythm, normal heart sounds and intact distal pulses. Exam reveals no gallop.  Pulmonary/Chest: Effort normal and breath sounds normal. No respiratory distress. She has no wheezes. She exhibits no tenderness. No breast tenderness, discharge or bleeding.  Abdominal: Soft. Bowel sounds are normal. She exhibits no distension, no abdominal bruit and no mass. There is no tenderness.  Genitourinary: No breast tenderness, discharge or bleeding.  Musculoskeletal: Normal range of motion. She exhibits no edema or tenderness.  Lymphadenopathy:    She has no cervical adenopathy.  Neurological: She is alert. She has normal reflexes. No cranial nerve deficit. She exhibits normal muscle tone. Coordination normal.  Skin: Skin is warm and dry. No rash noted. No erythema. No pallor.  Solar lentigines diffusely Few skin tags  Psychiatric: She has a normal mood and affect.  pleasant          Assessment & Plan:   Problem List Items Addressed This Visit      Cardiovascular and Mediastinum   Essential hypertension    bp in fair control at this time  BP Readings from Last 1 Encounters:  12/17/17 130/76   No changes needed Most recent labs reviewed  Disc lifstyle change with low sodium diet and exercise        Relevant Medications   lisinopril-hydrochlorothiazide (PRINZIDE,ZESTORETIC) 10-12.5 MG tablet     Musculoskeletal and Integument   Osteoporosis    dexa scheduled  Low D- high  dose tx 12 weeks also 4000 iu daily  Disc fall prev        Relevant Medications   ergocalciferol (VITAMIN D2) 50000 units capsule     Other   Estrogen deficiency   Relevant Orders   DG Bone Density   Routine general medical examination at a health care facility - Primary     Reviewed health habits including diet and exercise and skin cancer prevention Reviewed appropriate screening tests for age  Also reviewed health mt list, fam hx and immunization status , as well as social and family history   See HPI Labs rev  amw rev Pt will schedule her own mammogram  dexa scheduled

## 2017-12-18 ENCOUNTER — Other Ambulatory Visit: Payer: Self-pay | Admitting: Family Medicine

## 2017-12-18 DIAGNOSIS — Z1231 Encounter for screening mammogram for malignant neoplasm of breast: Secondary | ICD-10-CM

## 2017-12-21 NOTE — Assessment & Plan Note (Signed)
bp in fair control at this time  BP Readings from Last 1 Encounters:  12/17/17 130/76   No changes needed Most recent labs reviewed  Disc lifstyle change with low sodium diet and exercise

## 2017-12-21 NOTE — Assessment & Plan Note (Addendum)
Reviewed health habits including diet and exercise and skin cancer prevention Reviewed appropriate screening tests for age  Also reviewed health mt list, fam hx and immunization status , as well as social and family history   See HPI Labs rev  amw rev Pt will schedule her own mammogram  dexa scheduled

## 2017-12-21 NOTE — Assessment & Plan Note (Signed)
dexa scheduled  Low D- high dose tx 12 weeks also 4000 iu daily  Disc fall prev

## 2018-01-20 ENCOUNTER — Ambulatory Visit
Admission: RE | Admit: 2018-01-20 | Discharge: 2018-01-20 | Disposition: A | Discharge status: Home / Self Care | Payer: PPO | Source: Ambulatory Visit | Attending: Family Medicine | Admitting: Family Medicine

## 2018-01-20 DIAGNOSIS — Z1231 Encounter for screening mammogram for malignant neoplasm of breast: Secondary | ICD-10-CM | POA: Diagnosis not present

## 2018-01-20 DIAGNOSIS — E2839 Other primary ovarian failure: Secondary | ICD-10-CM | POA: Diagnosis not present

## 2018-01-20 DIAGNOSIS — M810 Age-related osteoporosis without current pathological fracture: Secondary | ICD-10-CM | POA: Diagnosis not present

## 2018-01-20 DIAGNOSIS — M8588 Other specified disorders of bone density and structure, other site: Secondary | ICD-10-CM | POA: Diagnosis not present

## 2018-01-20 DIAGNOSIS — Z780 Asymptomatic menopausal state: Secondary | ICD-10-CM | POA: Diagnosis not present

## 2018-01-21 DIAGNOSIS — H04123 Dry eye syndrome of bilateral lacrimal glands: Secondary | ICD-10-CM | POA: Diagnosis not present

## 2018-04-02 ENCOUNTER — Ambulatory Visit (INDEPENDENT_AMBULATORY_CARE_PROVIDER_SITE_OTHER): Payer: PPO

## 2018-04-02 ENCOUNTER — Encounter (INDEPENDENT_AMBULATORY_CARE_PROVIDER_SITE_OTHER): Payer: Self-pay

## 2018-04-02 DIAGNOSIS — Z23 Encounter for immunization: Secondary | ICD-10-CM

## 2018-08-24 DIAGNOSIS — D2270 Melanocytic nevi of unspecified lower limb, including hip: Secondary | ICD-10-CM | POA: Diagnosis not present

## 2018-08-24 DIAGNOSIS — L821 Other seborrheic keratosis: Secondary | ICD-10-CM | POA: Diagnosis not present

## 2018-08-24 DIAGNOSIS — D2272 Melanocytic nevi of left lower limb, including hip: Secondary | ICD-10-CM | POA: Diagnosis not present

## 2018-08-24 DIAGNOSIS — Z85828 Personal history of other malignant neoplasm of skin: Secondary | ICD-10-CM | POA: Diagnosis not present

## 2018-08-24 DIAGNOSIS — Z1283 Encounter for screening for malignant neoplasm of skin: Secondary | ICD-10-CM | POA: Diagnosis not present

## 2018-08-24 DIAGNOSIS — D2260 Melanocytic nevi of unspecified upper limb, including shoulder: Secondary | ICD-10-CM | POA: Diagnosis not present

## 2018-08-24 DIAGNOSIS — L812 Freckles: Secondary | ICD-10-CM | POA: Diagnosis not present

## 2018-08-24 DIAGNOSIS — D485 Neoplasm of uncertain behavior of skin: Secondary | ICD-10-CM | POA: Diagnosis not present

## 2018-08-24 DIAGNOSIS — D225 Melanocytic nevi of trunk: Secondary | ICD-10-CM | POA: Diagnosis not present

## 2018-08-24 DIAGNOSIS — L578 Other skin changes due to chronic exposure to nonionizing radiation: Secondary | ICD-10-CM | POA: Diagnosis not present

## 2018-08-24 DIAGNOSIS — L720 Epidermal cyst: Secondary | ICD-10-CM | POA: Diagnosis not present

## 2018-10-14 ENCOUNTER — Other Ambulatory Visit: Payer: Self-pay | Admitting: *Deleted

## 2018-10-14 MED ORDER — HYDROCHLOROTHIAZIDE 12.5 MG PO TABS: 6.2500 mg | ORAL_TABLET | Freq: Every day | ORAL | 1 refills | Status: DC

## 2018-10-14 MED ORDER — LISINOPRIL 10 MG PO TABS: 5.0000 mg | ORAL_TABLET | Freq: Every day | ORAL | 1 refills | Status: DC

## 2018-10-14 NOTE — Telephone Encounter (Signed)
I found the HCTZ tablets, I added it to this message

## 2018-10-14 NOTE — Telephone Encounter (Signed)
She takes lisinopril 10-12.5 mg  1/2 pill daily   It looks like hctz comes in capsule only, cannot split that  What does the pharmacy recommend ?

## 2018-10-14 NOTE — Telephone Encounter (Signed)
Received fax saying that the lisinopril/HCTZ is on backorder and they are requesting Dr. Glori Bickers send in 2 separate Rxs if she is okay with it for lisinopril and HCTZ by their selves   [facility]

## 2018-11-18 ENCOUNTER — Encounter: Payer: Self-pay | Admitting: Gastroenterology

## 2018-12-29 ENCOUNTER — Telehealth: Payer: Self-pay | Admitting: Family Medicine

## 2018-12-29 DIAGNOSIS — I10 Essential (primary) hypertension: Secondary | ICD-10-CM

## 2018-12-29 DIAGNOSIS — M810 Age-related osteoporosis without current pathological fracture: Secondary | ICD-10-CM

## 2018-12-29 DIAGNOSIS — Z0000 Encounter for general adult medical examination without abnormal findings: Secondary | ICD-10-CM

## 2018-12-29 NOTE — Telephone Encounter (Signed)
-----   Message from Ellamae Sia sent at 12/25/2018  2:14 PM EDT ----- Regarding: Lab orders for Wednesday, 6.24.20 Patient is scheduled for CPX labs, please order future labs, Thanks , Karna Christmas

## 2018-12-30 ENCOUNTER — Other Ambulatory Visit (INDEPENDENT_AMBULATORY_CARE_PROVIDER_SITE_OTHER): Payer: PPO

## 2018-12-30 ENCOUNTER — Ambulatory Visit (INDEPENDENT_AMBULATORY_CARE_PROVIDER_SITE_OTHER): Payer: PPO

## 2018-12-30 DIAGNOSIS — Z0000 Encounter for general adult medical examination without abnormal findings: Secondary | ICD-10-CM

## 2018-12-30 DIAGNOSIS — I10 Essential (primary) hypertension: Secondary | ICD-10-CM | POA: Diagnosis not present

## 2018-12-30 DIAGNOSIS — M810 Age-related osteoporosis without current pathological fracture: Secondary | ICD-10-CM

## 2018-12-30 LAB — CBC WITH DIFFERENTIAL/PLATELET
Basophils Absolute: 0 10*3/uL (ref 0.0–0.1)
Basophils Relative: 1 % (ref 0.0–3.0)
Eosinophils Absolute: 0.2 10*3/uL (ref 0.0–0.7)
Eosinophils Relative: 4.6 % (ref 0.0–5.0)
HCT: 41.4 % (ref 36.0–46.0)
Hemoglobin: 13.6 g/dL (ref 12.0–15.0)
Lymphocytes Relative: 34.8 % (ref 12.0–46.0)
Lymphs Abs: 1.7 10*3/uL (ref 0.7–4.0)
MCHC: 32.9 g/dL (ref 30.0–36.0)
MCV: 92 fl (ref 78.0–100.0)
Monocytes Absolute: 0.4 10*3/uL (ref 0.1–1.0)
Monocytes Relative: 8.7 % (ref 3.0–12.0)
Neutro Abs: 2.4 10*3/uL (ref 1.4–7.7)
Neutrophils Relative %: 50.9 % (ref 43.0–77.0)
Platelets: 189 10*3/uL (ref 150.0–400.0)
RBC: 4.5 Mil/uL (ref 3.87–5.11)
RDW: 13.3 % (ref 11.5–15.5)
WBC: 4.8 10*3/uL (ref 4.0–10.5)

## 2018-12-30 LAB — TSH: TSH: 2.22 u[IU]/mL (ref 0.35–4.50)

## 2018-12-30 LAB — COMPREHENSIVE METABOLIC PANEL
ALT: 18 U/L (ref 0–35)
AST: 20 U/L (ref 0–37)
Albumin: 4.3 g/dL (ref 3.5–5.2)
Alkaline Phosphatase: 55 U/L (ref 39–117)
BUN: 18 mg/dL (ref 6–23)
CO2: 26 mEq/L (ref 19–32)
Calcium: 9.6 mg/dL (ref 8.4–10.5)
Chloride: 104 mEq/L (ref 96–112)
Creatinine, Ser: 0.77 mg/dL (ref 0.40–1.20)
GFR: 73.47 mL/min (ref >60.00)
Glucose, Bld: 84 mg/dL (ref 70–99)
Potassium: 4.1 mEq/L (ref 3.5–5.1)
Sodium: 138 mEq/L (ref 135–145)
Total Bilirubin: 1.3 mg/dL — ABNORMAL HIGH (ref 0.2–1.2)
Total Protein: 6.6 g/dL (ref 6.0–8.3)

## 2018-12-30 LAB — LIPID PANEL
Cholesterol: 180 mg/dL (ref 0–200)
HDL: 79 mg/dL (ref >39.00)
LDL Cholesterol: 84 mg/dL (ref 0–99)
NonHDL: 100.91
Total CHOL/HDL Ratio: 2
Triglycerides: 84 mg/dL (ref 0.0–149.0)
VLDL: 16.8 mg/dL (ref 0.0–40.0)

## 2018-12-30 LAB — VITAMIN D 25 HYDROXY (VIT D DEFICIENCY, FRACTURES): VITD: 75.83 ng/mL (ref 30.00–100.00)

## 2018-12-30 NOTE — Progress Notes (Signed)
PCP notes:   Health maintenance:  Colonoscopy - to be determined  Abnormal screenings:   None  Patient concerns:   None  Nurse concerns:  None  Next PCP appt:   01/13/19 @ 0930

## 2018-12-30 NOTE — Progress Notes (Signed)
Subjective:   Marie Kennedy is a 73 y.o. female who presents for Medicare Annual (Subsequent) preventive examination.  Review of Systems:  N/A Cardiac Risk Factors include: advanced age (>63men, >59 women);hypertension     Objective:     Vitals: There were no vitals taken for this visit.  There is no height or weight on file to calculate BMI.  Advanced Directives 12/30/2018 12/10/2017 06/18/2017 05/19/2017 12/04/2016 11/29/2015 11/10/2013  Does Patient Have a Medical Advance Directive? No No No No No No Patient does not have advance directive  Would patient like information on creating a medical advance directive? No - Patient declined No - Patient declined No - Patient declined No - Patient declined No - Patient declined Yes - Scientist, clinical (histocompatibility and immunogenetics) given -    Tobacco Social History   Tobacco Use  Smoking Status Never Smoker  Smokeless Tobacco Never Used     Counseling given: No   Clinical Intake:  Pre-visit preparation completed: Yes  Pain : No/denies pain Pain Score: 0-No pain     Nutritional Status: BMI of 19-24  Normal Nutritional Risks: None Diabetes: No  How often do you need to have someone help you when you read instructions, pamphlets, or other written materials from your doctor or pharmacy?: 1 - Never What is the last grade level you completed in school?: Master degree  Interpreter Needed?: No  Comments: pt lives with spouse Information entered by :: LPinson, RN  Past Medical History:  Diagnosis Date  . Allergy   . Basal cell adenoma 11/14   face  . Cancer ([place])    skin  . Fibroids 1993   cysts endometriosis  . GERD (gastroesophageal reflux disease)   . Hypertension    PCP   Dr Glori Bickers at [facility]  . IBS (irritable bowel syndrome)   . Rosacea    Past Surgical History:  Procedure Laterality Date  . ABDOMINAL HYSTERECTOMY  1993  . BASAL CELL CARCINOMA EXCISION  11/14   face  . BREAST EXCISIONAL BIOPSY Right 1970   Negative  .  BREAST SURGERY Right 1970's   lumpectomy no cancer  . CATARACT EXTRACTION W/PHACO Left 05/19/2017   Procedure: CATARACT EXTRACTION PHACO AND INTRAOCULAR LENS PLACEMENT ([place]) LEFT;  Surgeon: Leandrew Koyanagi, MD;  Location: [place];  Service: Ophthalmology;  Laterality: Left;  . CATARACT EXTRACTION W/PHACO Right 06/18/2017   Procedure: CATARACT EXTRACTION PHACO AND INTRAOCULAR LENS PLACEMENT ([place])  COMPLICATED  RIGHT;  Surgeon: Leandrew Koyanagi, MD;  Location: [place];  Service: Ophthalmology;  Laterality: Right;  . CHOLECYSTECTOMY  06/18/2011   Procedure: LAPAROSCOPIC CHOLECYSTECTOMY WITH INTRAOPERATIVE CHOLANGIOGRAM;  Surgeon: Joyice Faster. Cornett, MD;  Location: [facility];  Service: General;  Laterality: N/A;  lap chole with IOC  . OOPHORECTOMY    . TONSILLECTOMY  1953   Family History  Problem Relation Age of Onset  . Hypertension Mother   . Cancer Mother        breast, uterine, and cervical  . Breast cancer Mother 42  . Heart disease Father        CAD  . Hypertension Father   . Diabetes Father        TYPE II  . Cancer Father        brain  . Colon cancer Neg Hx    Social History   Socioeconomic History  . Marital status: Married    Spouse name: Not on file  . Number of children: Not on file  .  Years of education: Not on file  . Highest education level: Not on file  Occupational History  . Not on file  Social Needs  . Financial resource strain: Not on file  . Food insecurity    Worry: Not on file    Inability: Not on file  . Transportation needs    Medical: Not on file    Non-medical: Not on file  Tobacco Use  . Smoking status: Never Smoker  . Smokeless tobacco: Never Used  Substance and Sexual Activity  . Alcohol use: No    Alcohol/week: 0.0 standard drinks  . Drug use: No  . Sexual activity: Yes  Lifestyle  . Physical activity    Days per week: Not on file    Minutes per session: Not on file  . Stress: Not on file  Relationships   . Social Herbalist on phone: Not on file    Gets together: Not on file    Attends religious service: Not on file    Active member of club or organization: Not on file    Attends meetings of clubs or organizations: Not on file    Relationship status: Not on file  Other Topics Concern  . Not on file  Social History Narrative  . Not on file    Outpatient Encounter Medications as of 12/30/2018  Medication Sig  . Calcium Carbonate-Vit D-Min 600-400 MG-UNIT TABS Take 1 tablet by mouth daily.   . Cholecalciferol (VITAMIN D3 PO) Take 4,000 Units by mouth daily.  . DiphenhydrAMINE HCl (ALLERGY MED PO) Take 25 mg by mouth daily as needed. Allergies   . hydrochlorothiazide (HYDRODIURIL) 12.5 MG tablet Take 0.5 tablets (6.25 mg total) by mouth daily.  . hyoscyamine (LEVSIN SL) 0.125 MG SL tablet Place 1 tablet (0.125 mg total) under the tongue 2 (two) times daily as needed for cramping. Abdominal cramping  . lisinopril (PRINIVIL,ZESTRIL) 10 MG tablet Take 0.5 tablets (5 mg total) by mouth daily.  . metroNIDAZOLE (METROCREAM) 0.75 % cream Apply 1 application topically daily. Rosacea  . [DISCONTINUED] ergocalciferol (VITAMIN D2) 50000 units capsule Take 1 capsule (50,000 Units total) by mouth once a week.  . [DISCONTINUED] Multiple Vitamin (MULTIVITAMIN) tablet Take 1 tablet by mouth daily.    No facility-administered encounter medications on file as of 12/30/2018.     Activities of Daily Living In your present state of health, do you have any difficulty performing the following activities: 12/30/2018  Hearing? N  Vision? N  Difficulty concentrating or making decisions? N  Walking or climbing stairs? N  Dressing or bathing? N  Doing errands, shopping? N  Preparing Food and eating ? N  Using the Toilet? N  In the past six months, have you accidently leaked urine? N  Do you have problems with loss of bowel control? N  Managing your Medications? N  Managing your Finances? N   Housekeeping or managing your Housekeeping? N  Some recent data might be hidden    Patient Care Team: Tower, Wynelle Fanny, MD as PCP - General Ralene Bathe, MD as Referring Physician (Dermatology) Karren Burly Deirdre Peer, MD as Referring Physician (Ophthalmology) Karsten Fells, DDS as Referring Physician (Dentistry)    Assessment:   This is a routine wellness examination for [place].  Exercise Activities and Dietary recommendations Current Exercise Habits: Home exercise routine, Type of exercise: walking, Time (Minutes): 60, Frequency (Times/Week): 7, Weekly Exercise (Minutes/Week): 420, Intensity: Mild, Exercise limited by: None identified  Goals    .  Increase physical activity     Starting 12/30/2018, I will continue to walk at least 60 minutes daily.        Fall Risk Fall Risk  12/30/2018 12/10/2017 12/04/2016 11/29/2015 09/20/2013  Falls in the past year? 0 No No No No   Depression Screen PHQ 2/9 Scores 12/30/2018 12/10/2017 12/04/2016 11/29/2015  PHQ - 2 Score 0 0 0 0  PHQ- 9 Score 0 0 - -     Cognitive Function MMSE - Mini Mental State Exam 12/10/2017 12/04/2016 11/29/2015  Orientation to time 5 5 5   Orientation to Place 5 5 5   Registration 3 3 3   Attention/ Calculation 0 0 0  Recall 3 3 3   Language- name 2 objects 0 0 0  Language- repeat 1 1 1   Language- follow 3 step command 3 3 3   Language- read & follow direction 0 0 0  Write a sentence 0 0 0  Copy design 0 0 0  Total score 20 20 20    PLEASE NOTE: A Mini-Cog screen was completed. Maximum score is 17. A value of 0 denotes this part of [place] MMSE was not completed or the patient failed this part of the Mini-Cog screening.   Mini-Cog Screening Orientation to Time - Max 5 pts Orientation to Place - Max 5 pts Registration - Max 3 pts Recall - Max 3 pts Language Repeat - Max 1 pts   Immunization History  Administered Date(s) Administered  . Influenza Whole 04/30/2006  . Influenza,inj,Quad PF,6+ Mos 04/22/2014,  05/23/2015, 04/15/2016, 05/01/2017, 04/02/2018  . Pneumococcal Conjugate-13 11/16/2014  . Pneumococcal Polysaccharide-23 12/12/2010  . Td 07/20/2002, 06/22/2012  . Zoster 07/03/2012    Screening Tests Health Maintenance  Topic Date Due  . COLONOSCOPY  07/08/2019 (Originally 12/04/2018)  . MAMMOGRAM  01/21/2019  . INFLUENZA VACCINE  02/06/2019  . TETANUS/TDAP  06/22/2022  . DEXA SCAN  Completed  . Hepatitis C Screening  Completed  . PNA vac Low Risk Adult  Completed      Plan:    I have personally reviewed, addressed, and noted the following in the patient's chart:  A. Medical and social history B. Use of alcohol, tobacco or illicit drugs  C. Current medications and supplements D. Functional ability and status E.  Nutritional status F.  Physical activity G. Advance directives H. List of other physicians I.  Hospitalizations, surgeries, and ER visits in previous 12 months J.  Vitals (unless it is a telemedicine encounter) K. Screenings to include cognitive, depression, hearing, vision (NOTE: hearing and vision screenings not completed in telemedicine encounter) L. Referrals and appointments   In addition, I have reviewed and discussed with patient certain preventive protocols, quality metrics, and best practice recommendations. A written personalized care plan for preventive services and recommendations were provided to patient.  With patient's permission, we connected on 12/30/18 at  8:30 AM EDT. Interactive audio and video telecommunications were attempted with patient. This attempt was unsuccessful due to patient having technical difficulties OR patient did not have access to video capability.  Encounter was completed with audio only.  Two patient identifiers were used to ensure the encounter occurred with the correct person. Patient was in home and writer was in office.     Signed,   Lindell Noe, MHA, BS, RN Health Coach

## 2018-12-30 NOTE — Progress Notes (Signed)
I reviewed health advisor's note, was available for consultation, and agree with documentation and plan.  

## 2018-12-30 NOTE — Patient Instructions (Signed)
Marie Kennedy , Thank you for taking time to come for your Medicare Wellness Visit. I appreciate your ongoing commitment to your health goals. Please review the following plan we discussed and let me know if I can assist you in the future.   These are the goals we discussed: Goals    . Increase physical activity     Starting 12/30/2018, I will continue to walk at least 60 minutes daily.        This is a list of the screening recommended for you and due dates:  Health Maintenance  Topic Date Due  . Colon Cancer Screening  07/08/2019*  . Mammogram  01/21/2019  . Flu Shot  02/06/2019  . Tetanus Vaccine  06/22/2022  . DEXA scan (bone density measurement)  Completed  .  Hepatitis C: One time screening is recommended by Center for Disease Control  (CDC) for  adults born from [birth year] through 1965.   Completed  . Pneumonia vaccines  Completed  *Topic was postponed. The date shown is not the original due date.   Preventive Care for Adults  A healthy lifestyle and preventive care can promote health and wellness. Preventive health guidelines for adults include the following key practices.  . A routine yearly physical is a good way to check with your health care provider about your health and preventive screening. It is a chance to share any concerns and updates on your health and to receive a thorough exam.  . Visit your dentist for a routine exam and preventive care every 6 months. Brush your teeth twice a day and floss once a day. Good oral hygiene prevents tooth decay and gum disease.  . The frequency of eye exams is based on your age, health, family medical history, use  of contact lenses, and other factors. Follow your health care provider's recommendations for frequency of eye exams.  . Eat a healthy diet. Foods like vegetables, fruits, whole grains, low-fat dairy products, and lean protein foods contain the nutrients you need without too many calories. Decrease your intake of foods high in  solid fats, added sugars, and salt. Eat the right amount of calories for you. Get information about a proper diet from your health care provider, if necessary.  . Regular physical exercise is one of the most important things you can do for your health. Most adults should get at least 150 minutes of moderate-intensity exercise (any activity that increases your heart rate and causes you to sweat) each week. In addition, most adults need muscle-strengthening exercises on 2 or more days a week.  Silver Sneakers may be a benefit available to you. To determine eligibility, you may visit the website: www.silversneakers.com or contact program at (802) 703-1694 Mon-Fri between 8AM-8PM.   . Maintain a healthy weight. The body mass index (BMI) is a screening tool to identify possible weight problems. It provides an estimate of body fat based on height and weight. Your health care provider can find your BMI and can help you achieve or maintain a healthy weight.   For adults 20 years and older: ? A BMI below 18.5 is considered underweight. ? A BMI of 18.5 to 24.9 is normal. ? A BMI of 25 to 29.9 is considered overweight. ? A BMI of 30 and above is considered obese.   . Maintain normal blood lipids and cholesterol levels by exercising and minimizing your intake of saturated fat. Eat a balanced diet with plenty of fruit and vegetables. Blood tests for lipids and cholesterol  should begin at age 64 and be repeated every 5 years. If your lipid or cholesterol levels are high, you are over 50, or you are at high risk for heart disease, you may need your cholesterol levels checked more frequently. Ongoing high lipid and cholesterol levels should be treated with medicines if diet and exercise are not working.  . If you smoke, find out from your health care provider how to quit. If you do not use tobacco, please do not start.  . If you choose to drink alcohol, please do not consume more than 2 drinks per day. One drink  is considered to be 12 ounces (355 mL) of beer, 5 ounces (148 mL) of wine, or 1.5 ounces (44 mL) of liquor.  . If you are 38-47 years old, ask your health care provider if you should take aspirin to prevent strokes.  . Use sunscreen. Apply sunscreen liberally and repeatedly throughout the day. You should seek shade when your shadow is shorter than you. Protect yourself by wearing long sleeves, pants, a wide-brimmed hat, and sunglasses year round, whenever you are outdoors.  . Once a month, do a whole body skin exam, using a mirror to look at the skin on your back. Tell your health care provider of new moles, moles that have irregular borders, moles that are larger than a pencil eraser, or moles that have changed in shape or color.

## 2019-01-01 ENCOUNTER — Encounter: Payer: PPO | Admitting: Family Medicine

## 2019-01-13 ENCOUNTER — Other Ambulatory Visit: Payer: Self-pay

## 2019-01-13 ENCOUNTER — Ambulatory Visit (INDEPENDENT_AMBULATORY_CARE_PROVIDER_SITE_OTHER): Payer: PPO | Admitting: Family Medicine

## 2019-01-13 ENCOUNTER — Encounter: Payer: Self-pay | Admitting: Family Medicine

## 2019-01-13 VITALS — BP 114/72 | HR 70 | Temp 98.1°F | Ht 62.25 in | Wt 128.1 lb

## 2019-01-13 DIAGNOSIS — M810 Age-related osteoporosis without current pathological fracture: Secondary | ICD-10-CM

## 2019-01-13 DIAGNOSIS — Z1231 Encounter for screening mammogram for malignant neoplasm of breast: Secondary | ICD-10-CM

## 2019-01-13 DIAGNOSIS — Z1211 Encounter for screening for malignant neoplasm of colon: Secondary | ICD-10-CM

## 2019-01-13 DIAGNOSIS — I10 Essential (primary) hypertension: Secondary | ICD-10-CM

## 2019-01-13 DIAGNOSIS — Z0000 Encounter for general adult medical examination without abnormal findings: Secondary | ICD-10-CM

## 2019-01-13 NOTE — Assessment & Plan Note (Signed)
Rev dexa 7/19  Walking  Good D level taking ca and D No falls or fx Declines any medication for fracture risk reduction at this time

## 2019-01-13 NOTE — Assessment & Plan Note (Signed)
Mild/stable  No symptoms

## 2019-01-13 NOTE — Assessment & Plan Note (Signed)
Pt is due for a colonoscopy (5 y recall for polyp) She wants to put this off or re consider Also declines cologuard or ifob  She will call us if she changes her mind

## 2019-01-13 NOTE — Assessment & Plan Note (Signed)
Reviewed health habits including diet and exercise and skin cancer prevention Reviewed appropriate screening tests for age  Also reviewed health mt list, fam hx and immunization status , as well as social and family history   See HPI Labs reviewed amw rev  Mammogram ref done  Pt declines colonoscopy/screening  Also declines OP medication  Good health habits

## 2019-01-13 NOTE — Progress Notes (Signed)
Subjective:    Patient ID: Marie Kennedy, female    DOB: 08/27/1945, 73 y.o.   MRN: [id number]  HPI Here for health maintenance exam and to review chronic medical problems   Feeling good   Staying in /staying away from crowds  amw was 6/24 No concerns Disc colon screening  Colonoscopy 5/15 with 5 y recall (adenomatous polyp)  Wants to put off  Also declines cologuard or ifob at this time    Mammogram 7/19 - wants to schedule at [facility] Self breast exam -no lumps or changes  Mother had breast cancer at 63 / also uterine and cervical   dexa 7/19 -OP slt worse at the spine Has not noticed any ht loss  Declines any OP med at this time  Falls-none  fx -none  Supplements -taking ca and D D level is 75.8- great  Exercise-walking    Weight  Wt Readings from Last 3 Encounters:  01/13/19 128 lb 1 oz (58.1 kg)  12/17/17 123 lb (55.8 kg)  12/10/17 121 lb (54.9 kg)  mt a good wt  Eating healthy  Walking for exercise  Not doing carry out or eating out  23.24 kg/m   bp is stable today  No cp or palpitations or headaches or edema  No side effects to medicines  BP Readings from Last 3 Encounters:  01/13/19 114/72  12/17/17 130/76  12/10/17 104/60     zostavax 12/13   Lab Results  Component Value Date   CREATININE 0.77 12/30/2018   BUN 18 12/30/2018   NA 138 12/30/2018   K 4.1 12/30/2018   CL 104 12/30/2018   CO2 26 12/30/2018   Lab Results  Component Value Date   ALT 18 12/30/2018   AST 20 12/30/2018   ALKPHOS 55 12/30/2018   BILITOT 1.3 (H) 12/30/2018    Lab Results  Component Value Date   WBC 4.8 12/30/2018   HGB 13.6 12/30/2018   HCT 41.4 12/30/2018   MCV 92.0 12/30/2018   PLT 189.0 12/30/2018    Lab Results  Component Value Date   TSH 2.22 12/30/2018    Glucose 84  Cholesterol  Lab Results  Component Value Date   CHOL 180 12/30/2018   CHOL 179 12/10/2017   CHOL 185 12/04/2016   Lab Results  Component Value Date   HDL 79.00 12/30/2018   HDL 79.60 12/10/2017   HDL 84.60 12/04/2016   Lab Results  Component Value Date   LDLCALC 84 12/30/2018   LDLCALC 89 12/10/2017   LDLCALC 85 12/04/2016   Lab Results  Component Value Date   TRIG 84.0 12/30/2018   TRIG 50.0 12/10/2017   TRIG 75.0 12/04/2016   Lab Results  Component Value Date   CHOLHDL 2 12/30/2018   CHOLHDL 2 12/10/2017   CHOLHDL 2 12/04/2016   No results found for: LDLDIRECT   Very good profile   Patient Active Problem List   Diagnosis Date Noted  . Routine general medical examination at a health care facility 11/22/2015  . Estrogen deficiency 11/22/2015  . Exposure to communicable disease 11/22/2015  . Colon cancer screening 06/22/2012  . Hyperbilirubinemia 06/22/2012  . Osteoporosis 12/12/2010  . Post-menopausal 12/12/2010  . Screening mammogram, encounter for 12/04/2010  . ALLERGIC RHINITIS, SEASONAL 11/13/2009  . DEPRESSION 05/04/2007  . Essential hypertension 05/04/2007  . IBS 05/04/2007  . ROSACEA 05/04/2007   Past Medical History:  Diagnosis Date  . Allergy   . Basal cell adenoma 11/14   face  .  Cancer ([place])    skin  . Fibroids 1993   cysts endometriosis  . GERD (gastroesophageal reflux disease)   . Hypertension    PCP   Dr Glori Bickers at [facility]  . IBS (irritable bowel syndrome)   . Rosacea    Past Surgical History:  Procedure Laterality Date  . ABDOMINAL HYSTERECTOMY  1993  . BASAL CELL CARCINOMA EXCISION  11/14   face  . BREAST EXCISIONAL BIOPSY Right 1970   Negative  . BREAST SURGERY Right 1970's   lumpectomy no cancer  . CATARACT EXTRACTION W/PHACO Left 05/19/2017   Procedure: CATARACT EXTRACTION PHACO AND INTRAOCULAR LENS PLACEMENT ([place]) LEFT;  Surgeon: Leandrew Koyanagi, MD;  Location: [place];  Service: Ophthalmology;  Laterality: Left;  . CATARACT EXTRACTION W/PHACO Right 06/18/2017   Procedure: CATARACT EXTRACTION PHACO AND INTRAOCULAR LENS PLACEMENT ([place])  COMPLICATED  RIGHT;  Surgeon:  Leandrew Koyanagi, MD;  Location: [place];  Service: Ophthalmology;  Laterality: Right;  . CHOLECYSTECTOMY  06/18/2011   Procedure: LAPAROSCOPIC CHOLECYSTECTOMY WITH INTRAOPERATIVE CHOLANGIOGRAM;  Surgeon: Joyice Faster. Cornett, MD;  Location: [facility];  Service: General;  Laterality: N/A;  lap chole with IOC  . OOPHORECTOMY    . TONSILLECTOMY  1953   Social History   Tobacco Use  . Smoking status: Never Smoker  . Smokeless tobacco: Never Used  Substance Use Topics  . Alcohol use: No    Alcohol/week: 0.0 standard drinks  . Drug use: No   Family History  Problem Relation Age of Onset  . Hypertension Mother   . Cancer Mother        breast, uterine, and cervical  . Breast cancer Mother 41  . Heart disease Father        CAD  . Hypertension Father   . Diabetes Father        TYPE II  . Cancer Father        brain  . Colon cancer Neg Hx    No Known Allergies Current Outpatient Medications on File Prior to Visit  Medication Sig Dispense Refill  . Calcium Carbonate-Vit D-Min 600-400 MG-UNIT TABS Take 1 tablet by mouth daily.     . Cholecalciferol (VITAMIN D3 PO) Take 4,000 Units by mouth daily.    . DiphenhydrAMINE HCl (ALLERGY MED PO) Take 25 mg by mouth daily as needed. Allergies     . hydrochlorothiazide (HYDRODIURIL) 12.5 MG tablet Take 0.5 tablets (6.25 mg total) by mouth daily. 45 tablet 1  . hyoscyamine (LEVSIN SL) 0.125 MG SL tablet Place 1 tablet (0.125 mg total) under the tongue 2 (two) times daily as needed for cramping. Abdominal cramping 30 tablet 11  . lisinopril (PRINIVIL,ZESTRIL) 10 MG tablet Take 0.5 tablets (5 mg total) by mouth daily. 45 tablet 1  . metroNIDAZOLE (METROCREAM) 0.75 % cream Apply 1 application topically daily. Rosacea     No current facility-administered medications on file prior to visit.     Review of Systems  Constitutional: Negative for activity change, appetite change, fatigue, fever and unexpected weight change.  HENT: Negative for  congestion, ear pain, rhinorrhea, sinus pressure and sore throat.   Eyes: Negative for pain, redness and visual disturbance.  Respiratory: Negative for cough, shortness of breath and wheezing.   Cardiovascular: Negative for chest pain and palpitations.  Gastrointestinal: Negative for abdominal pain, blood in stool, constipation and diarrhea.  Endocrine: Negative for polydipsia and polyuria.  Genitourinary: Negative for dysuria, frequency and urgency.  Musculoskeletal: Positive for arthralgias. Negative for  back pain and myalgias.  Skin: Negative for pallor and rash.  Allergic/Immunologic: Negative for environmental allergies.  Neurological: Negative for dizziness, syncope and headaches.  Hematological: Negative for adenopathy. Does not bruise/bleed easily.  Psychiatric/Behavioral: Negative for decreased concentration and dysphoric mood. The patient is not nervous/anxious.        Objective:   Physical Exam Constitutional:      General: She is not in acute distress.    Appearance: Normal appearance. She is well-developed and normal weight. She is not ill-appearing or diaphoretic.  HENT:     Head: Normocephalic and atraumatic.     Right Ear: Tympanic membrane, ear canal and external ear normal.     Left Ear: Tympanic membrane, ear canal and external ear normal.     Nose: Nose normal.     Mouth/Throat:     Mouth: Mucous membranes are moist.     Pharynx: Oropharynx is clear.  Eyes:     General: No scleral icterus.    Conjunctiva/sclera: Conjunctivae normal.     Pupils: Pupils are equal, round, and reactive to light.  Neck:     Musculoskeletal: Normal range of motion and neck supple. No muscular tenderness.     Thyroid: No thyromegaly.     Vascular: No carotid bruit or JVD.  Cardiovascular:     Rate and Rhythm: Normal rate and regular rhythm.     Heart sounds: Normal heart sounds. No gallop.   Pulmonary:     Effort: Pulmonary effort is normal. No respiratory distress.     Breath  sounds: Normal breath sounds. No wheezing or rales.  Chest:     Chest wall: No tenderness.  Abdominal:     General: Bowel sounds are normal. There is no distension or abdominal bruit.     Palpations: Abdomen is soft. There is no mass.     Tenderness: There is no abdominal tenderness.  Genitourinary:    Comments: Breast exam: No mass, nodules, thickening, tenderness, bulging, retraction, inflamation, nipple discharge or skin changes noted.  No axillary or clavicular LA.     Musculoskeletal: Normal range of motion.        General: No tenderness.     Right lower leg: No edema.     Left lower leg: No edema.  Lymphadenopathy:     Cervical: No cervical adenopathy.  Skin:    General: Skin is warm and dry.     Coloration: Skin is not pale.     Findings: No erythema or rash.     Comments: Solar lentigines diffusely sks on trunk   Neurological:     Mental Status: She is alert.     Cranial Nerves: No cranial nerve deficit.     Motor: No abnormal muscle tone.     Coordination: Coordination normal.     Gait: Gait normal.     Deep Tendon Reflexes: Reflexes are normal and symmetric. Reflexes normal.  Psychiatric:        Mood and Affect: Mood normal.           Assessment & Plan:   Problem List Items Addressed This Visit      Cardiovascular and Mediastinum   Essential hypertension    bp in fair control at this time  BP Readings from Last 1 Encounters:  01/13/19 114/72   No changes needed Most recent labs reviewed  Disc lifstyle change with low sodium diet and exercise          Musculoskeletal and Integument   Osteoporosis  Rev dexa 7/19  Walking  Good D level taking ca and D No falls or fx Declines any medication for fracture risk reduction at this time         Other   Screening mammogram, encounter for   Relevant Orders   MM 3D SCREEN BREAST BILATERAL   Colon cancer screening    Pt is due for a colonoscopy (5 y recall for polyp) She wants to put this off or re  consider Also declines cologuard or ifob  She will call us if she changes her mind       Hyperbilirubinemia    Mild/stable  No symptoms       Routine general medical examination at a health care facility - Primary    Reviewed health habits including diet and exercise and skin cancer prevention Reviewed appropriate screening tests for age  Also reviewed health mt list, fam hx and immunization status , as well as social and family history   See HPI Labs reviewed amw rev  Mammogram ref done  Pt declines colonoscopy/screening  Also declines OP medication  Good health habits

## 2019-01-13 NOTE — Patient Instructions (Addendum)
Let us know when/if you want to get a colonoscopy   Stay active  Keep walking  Keep eating a healthy diet   Continue your calcium and vitamin D  Labs look good

## 2019-01-13 NOTE — Assessment & Plan Note (Signed)
bp in fair control at this time  BP Readings from Last 1 Encounters:  01/13/19 114/72   No changes needed Most recent labs reviewed  Disc lifstyle change with low sodium diet and exercise

## 2019-02-16 DIAGNOSIS — Z961 Presence of intraocular lens: Secondary | ICD-10-CM | POA: Diagnosis not present

## 2019-02-24 ENCOUNTER — Ambulatory Visit
Admission: RE | Admit: 2019-02-24 | Discharge: 2019-02-24 | Disposition: A | Discharge status: Home / Self Care | Payer: PPO | Source: Ambulatory Visit | Attending: Family Medicine | Admitting: Family Medicine

## 2019-02-24 DIAGNOSIS — Z1231 Encounter for screening mammogram for malignant neoplasm of breast: Secondary | ICD-10-CM | POA: Insufficient documentation

## 2019-03-09 ENCOUNTER — Ambulatory Visit (INDEPENDENT_AMBULATORY_CARE_PROVIDER_SITE_OTHER): Payer: PPO

## 2019-03-09 DIAGNOSIS — Z23 Encounter for immunization: Secondary | ICD-10-CM | POA: Diagnosis not present

## 2019-04-07 ENCOUNTER — Other Ambulatory Visit: Payer: Self-pay | Admitting: Family Medicine

## 2019-06-02 ENCOUNTER — Other Ambulatory Visit: Payer: Self-pay

## 2019-08-21 ENCOUNTER — Ambulatory Visit: Payer: PPO

## 2019-10-13 ENCOUNTER — Other Ambulatory Visit: Payer: Self-pay

## 2019-10-13 ENCOUNTER — Ambulatory Visit: Payer: PPO | Admitting: Dermatology

## 2019-10-13 DIAGNOSIS — L821 Other seborrheic keratosis: Secondary | ICD-10-CM

## 2019-10-13 DIAGNOSIS — D485 Neoplasm of uncertain behavior of skin: Secondary | ICD-10-CM

## 2019-10-13 DIAGNOSIS — L719 Rosacea, unspecified: Secondary | ICD-10-CM | POA: Diagnosis not present

## 2019-10-13 DIAGNOSIS — Z1283 Encounter for screening for malignant neoplasm of skin: Secondary | ICD-10-CM

## 2019-10-13 DIAGNOSIS — Z85828 Personal history of other malignant neoplasm of skin: Secondary | ICD-10-CM | POA: Diagnosis not present

## 2019-10-13 DIAGNOSIS — D229 Melanocytic nevi, unspecified: Secondary | ICD-10-CM

## 2019-10-13 DIAGNOSIS — D1800 Hemangioma unspecified site: Secondary | ICD-10-CM | POA: Diagnosis not present

## 2019-10-13 DIAGNOSIS — D2261 Melanocytic nevi of right upper limb, including shoulder: Secondary | ICD-10-CM

## 2019-10-13 DIAGNOSIS — L814 Other melanin hyperpigmentation: Secondary | ICD-10-CM

## 2019-10-13 DIAGNOSIS — L578 Other skin changes due to chronic exposure to nonionizing radiation: Secondary | ICD-10-CM

## 2019-10-13 DIAGNOSIS — L570 Actinic keratosis: Secondary | ICD-10-CM | POA: Diagnosis not present

## 2019-10-13 MED ORDER — METRONIDAZOLE 0.75 % EX CREA: 1.0000 "application " | TOPICAL_CREAM | Freq: Every day | CUTANEOUS | 3 refills | Status: DC

## 2019-10-13 MED ORDER — METRONIDAZOLE 0.75 % EX LOTN: 1.0000 "application " | TOPICAL_LOTION | Freq: Every day | CUTANEOUS | 3 refills | Status: AC

## 2019-10-13 NOTE — Progress Notes (Signed)
Follow-Up Visit   Subjective  Marie Kennedy is a 74 y.o. female who presents for the following: Annual Exam (History of BCC) and Rosacea (Needs a refill of Metronidazole cream and lotion.).  Patient presents for total body skin exam for skin cancer screening with history of skin cancer in the past.  The following portions of the chart were reviewed this encounter and updated as appropriate:     Review of Systems: No other skin or systemic complaints.  Objective  Well appearing patient in no apparent distress; mood and affect are within normal limits.  A full examination was performed including scalp, head, eyes, ears, nose, lips, neck, chest, axillae, abdomen, back, buttocks, bilateral upper extremities, bilateral lower extremities, hands, feet, fingers, toes, fingernails, and toenails. All findings within normal limits unless otherwise noted below.  Objective  Right forehead, Right ala of nose, Left lat prox elbow, Right sup forehead: Well healed scar with no evidence of recurrence.   Objective  Left prox medial forearm: 0.6 cm pearly papule.  Objective  Right lat deltoid: 0.3 cm blue -gray papule.  Objective  Face: Erythema.  Assessment & Plan    Skin cancer screening performed today.  Seborrheic Keratoses - Stuck-on, waxy, tan-brown papules and plaques  - Discussed benign etiology and prognosis. - Observe - Call for any changes  Actinic Damage - diffuse scaly erythematous macules with underlying dyspigmentation - Recommend daily broad spectrum sunscreen SPF 30+ to sun-exposed areas, reapply every 2 hours as needed.  - Call for new or changing lesions. Lentigines - Scattered tan macules - Discussed due to sun exposure - Benign, observe - Call for any changes  Melanocytic Nevi - Tan-brown and/or pink-flesh-colored symmetric macules and papules - Benign appearing on exam today - Observation - Call clinic for new or changing moles - Recommend daily use of  broad spectrum spf 30+ sunscreen to sun-exposed areas.   Hemangiomas - Red papules - Discussed benign nature - Observe - Call for any changes   History of basal cell carcinoma (BCC) Right forehead, Right ala of nose, Left lat prox elbow, Right sup forehead  Clear today. Observe   Neoplasm of uncertain behavior of skin Left prox medial forearm  Epidermal / dermal shaving  Lesion length (cm):  0.6 Lesion width (cm):  0.6 Margin per side (cm):  0.2 Total excision diameter (cm):  1 Informed consent: discussed and consent obtained   Timeout: patient name, date of birth, surgical site, and procedure verified   Procedure prep:  Patient was prepped and draped in usual sterile fashion Prep type:  Isopropyl alcohol Anesthesia: the lesion was anesthetized in a standard fashion   Anesthetic:  1% lidocaine w/ epinephrine 1-100,000 buffered w/ 8.4% NaHCO3 Instrument used: flexible razor blade   Hemostasis achieved with: pressure, aluminum chloride and electrodesiccation   Outcome: patient tolerated procedure well   Post-procedure details: sterile dressing applied and wound care instructions given   Dressing type: bandage and petrolatum    Destruction of lesion Complexity: extensive   Destruction method: electrodesiccation and curettage   Informed consent: discussed and consent obtained   Anesthesia: the lesion was anesthetized in a standard fashion   Anesthetic:  1% lidocaine w/ epinephrine 1-100,000 nerve block Curettage performed in three different directions: Yes   Electrodesiccation performed over the curetted area: Yes   Hemostasis achieved with:  pressure, aluminum chloride and electrodesiccation Outcome: patient tolerated procedure well with no complications   Post-procedure details: sterile dressing applied and wound care instructions given  Dressing type: bandage and petrolatum    Specimen 1 - Surgical pathology Differential Diagnosis: BCC vs other  Check Margins:  No 0.6 cm pearly papule. EDC today  Nevus Right lat deltoid  Blue nevus - Benign appearing . Observe   Rosacea Face  Discussed BBL.  Ordered Medications: METRONIDAZOLE, TOPICAL, 0.75 % LOTN  Reordered Medications metroNIDAZOLE (METROCREAM) 0.75 % cream  Return in about 1 year (around 10/12/2020).   I, Ashok Cordia, CMA, am acting as scribe for Sarina Ser, MD .

## 2019-10-18 ENCOUNTER — Encounter: Payer: Self-pay | Admitting: Dermatology

## 2019-10-18 ENCOUNTER — Telehealth: Payer: Self-pay

## 2019-10-18 NOTE — Telephone Encounter (Signed)
Biopsy results discussed  

## 2019-10-21 ENCOUNTER — Ambulatory Visit (AMBULATORY_SURGERY_CENTER): Payer: Self-pay | Admitting: *Deleted

## 2019-10-21 ENCOUNTER — Other Ambulatory Visit: Payer: Self-pay

## 2019-10-21 VITALS — Temp 97.0°F | Ht 62.0 in | Wt 125.0 lb

## 2019-10-21 DIAGNOSIS — Z86010 Personal history of colonic polyps: Secondary | ICD-10-CM

## 2019-10-21 MED ORDER — SUTAB 1479-225-188 MG PO TABS: 1.0000 | ORAL_TABLET | Freq: Once | ORAL | 0 refills | Status: AC

## 2019-10-21 NOTE — Progress Notes (Signed)

## 2019-10-26 ENCOUNTER — Telehealth: Payer: Self-pay | Admitting: Gastroenterology

## 2019-10-26 DIAGNOSIS — Z86010 Personal history of colonic polyps: Secondary | ICD-10-CM

## 2019-10-26 MED ORDER — SUTAB 1479-225-188 MG PO TABS: 1.0000 | ORAL_TABLET | ORAL | 0 refills | Status: DC

## 2019-10-26 NOTE — Telephone Encounter (Signed)
Pt is scheduled for 4/29 colon and stated that pharmacy has not received prep script.

## 2019-10-26 NOTE — Telephone Encounter (Signed)
Resent Sutab to pharmacy per pt's request. Pt called and notified.

## 2019-11-02 ENCOUNTER — Encounter: Payer: Self-pay | Admitting: Gastroenterology

## 2019-11-04 ENCOUNTER — Other Ambulatory Visit: Payer: Self-pay

## 2019-11-04 ENCOUNTER — Ambulatory Visit (AMBULATORY_SURGERY_CENTER): Payer: PPO | Admitting: Gastroenterology

## 2019-11-04 ENCOUNTER — Encounter: Payer: Self-pay | Admitting: Gastroenterology

## 2019-11-04 VITALS — BP 146/70 | HR 68 | Temp 97.1°F | Resp 15 | Ht 62.0 in | Wt 125.0 lb

## 2019-11-04 DIAGNOSIS — Z86010 Personal history of colonic polyps: Secondary | ICD-10-CM

## 2019-11-04 DIAGNOSIS — Z1211 Encounter for screening for malignant neoplasm of colon: Secondary | ICD-10-CM | POA: Diagnosis not present

## 2019-11-04 MED ORDER — SODIUM CHLORIDE 0.9 % IV SOLN: 500.0000 mL | INTRAVENOUS | Status: DC

## 2019-11-04 NOTE — Progress Notes (Signed)
Temp LC V/s CW I have reviewed the patient's medical history in detail and updated the computerized patient record. 

## 2019-11-04 NOTE — Patient Instructions (Signed)
Handouts for hemrrohoids and diverticulosis.  YOU HAD AN ENDOSCOPIC PROCEDURE TODAY AT [facility]:   Refer to the procedure report that was given to you for any specific questions about what was found during the examination.  If the procedure report does not answer your questions, please call your gastroenterologist to clarify.  If you requested that your care partner not be given the details of your procedure findings, then the procedure report has been included in a sealed envelope for you to review at your convenience later.  YOU SHOULD EXPECT: Some feelings of bloating in the abdomen. Passage of more gas than usual.  Walking can help get rid of the air that was put into your GI tract during the procedure and reduce the bloating. If you had a lower endoscopy (such as a colonoscopy or flexible sigmoidoscopy) you may notice spotting of blood in your stool or on the toilet paper. If you underwent a bowel prep for your procedure, you may not have a normal bowel movement for a few days.  Please Note:  You might notice some irritation and congestion in your nose or some drainage.  This is from the oxygen used during your procedure.  There is no need for concern and it should clear up in a day or so.  SYMPTOMS TO REPORT IMMEDIATELY:   Following lower endoscopy (colonoscopy or flexible sigmoidoscopy):  Excessive amounts of blood in the stool  Significant tenderness or worsening of abdominal pains  Swelling of the abdomen that is new, acute  Fever of 100F or higher  For urgent or emergent issues, a gastroenterologist can be reached at any hour by calling [phone]. Do not use MyChart messaging for urgent concerns.    DIET:  We do recommend a small meal at first, but then you may proceed to your regular diet.  Drink plenty of fluids but you should avoid alcoholic beverages for 24 hours.  ACTIVITY:  You should plan to take it easy for the rest of today and you should NOT DRIVE  or use heavy machinery until tomorrow (because of the sedation medicines used during the test).    FOLLOW UP: Our staff will call the number listed on your records 48-72 hours following your procedure to check on you and address any questions or concerns that you may have regarding the information given to you following your procedure. If we do not reach you, we will leave a message.  We will attempt to reach you two times.  During this call, we will ask if you have developed any symptoms of COVID 19. If you develop any symptoms (ie: fever, flu-like symptoms, shortness of breath, cough etc.) before then, please call [phone].  If you test positive for Covid 19 in the 2 weeks post procedure, please call and report this information to [place].    If any biopsies were taken you will be contacted by phone or by letter within the next 1-3 weeks.  Please call us at [phone] if you have not heard about the biopsies in 3 weeks.    SIGNATURES/CONFIDENTIALITY: You and/or your care partner have signed paperwork which will be entered into your electronic medical record.  These signatures attest to the fact that that the information above on your After Visit Summary has been reviewed and is understood.  Full responsibility of the confidentiality of this discharge information lies with you and/or your care-partner.

## 2019-11-04 NOTE — Op Note (Signed)
[place] Patient Name: Marie Kennedy Procedure Date: 11/04/2019 9:26 AM MRN: [id number] Endoscopist: Mauri Pole , MD Age: 74 Referring MD:  Date of Birth: 1945/08/12 Gender: Female Account #: [id number] Procedure:                Colonoscopy Indications:              High risk colon cancer surveillance: Personal                            history of adenoma less than 10 mm in size Medicines:                Monitored Anesthesia Care Procedure:                Pre-Anesthesia Assessment:                           - Prior to the procedure, a History and Physical                            was performed, and patient medications and                            allergies were reviewed. The patient's tolerance of                            previous anesthesia was also reviewed. The risks                            and benefits of the procedure and the sedation                            options and risks were discussed with the patient.                            All questions were answered, and informed consent                            was obtained. Prior Anticoagulants: The patient has                            taken no previous anticoagulant or antiplatelet                            agents. ASA Grade Assessment: II - A patient with                            mild systemic disease. After reviewing the risks                            and benefits, the patient was deemed in                            satisfactory condition to undergo the procedure.  After obtaining informed consent, the colonoscope                            was passed under direct vision. Throughout the                            procedure, the patient's blood pressure, pulse, and                            oxygen saturations were monitored continuously. The                            Colonoscope was introduced through the anus and                            advanced to the the  cecum, identified by                            appendiceal orifice and ileocecal valve. The                            colonoscopy was performed without difficulty. The                            patient tolerated the procedure well. The quality                            of the bowel preparation was excellent. The                            ileocecal valve, appendiceal orifice, and rectum                            were photographed. Scope In: 9:30:44 AM Scope Out: 9:59:26 AM Scope Withdrawal Time: 0 hours 15 minutes 3 seconds  Total Procedure Duration: 0 hours 28 minutes 42 seconds  Findings:                 The perianal and digital rectal examinations were                            normal.                           A few small-mouthed diverticula were found in the                            sigmoid colon.                           Non-bleeding internal hemorrhoids were found during                            retroflexion. The hemorrhoids were small. Complications:            No immediate complications. Estimated Blood Loss:  Estimated blood loss: none. Impression:               - Diverticulosis in the sigmoid colon.                           - Non-bleeding internal hemorrhoids.                           - No specimens collected. Recommendation:           - Patient has a contact number available for                            emergencies. The signs and symptoms of potential                            delayed complications were discussed with the                            patient. Return to normal activities tomorrow.                            Written discharge instructions were provided to the                            patient.                           - Resume previous diet.                           - Continue present medications.                           - No repeat colonoscopy due to age and the absence                            of colonic polyps. Mauri Pole,  MD 11/04/2019 10:06:10 AM This report has been signed electronically.

## 2019-11-04 NOTE — Progress Notes (Signed)
A and O x3. Report to RN. Tolerated MAC anesthesia well.

## 2019-11-08 ENCOUNTER — Telehealth: Payer: Self-pay

## 2019-11-08 ENCOUNTER — Telehealth: Payer: Self-pay | Admitting: *Deleted

## 2019-11-08 NOTE — Telephone Encounter (Signed)
Left message on follow up call. 

## 2019-11-08 NOTE — Telephone Encounter (Signed)
  Follow up Call-  Call back number 11/04/2019  Post procedure Call Back phone  # [phone]  Permission to leave phone message Yes  Some recent data might be hidden     Patient questions:  Message left to call us if necessary.

## 2020-01-06 ENCOUNTER — Other Ambulatory Visit: Payer: Self-pay | Admitting: Family Medicine

## 2020-01-20 ENCOUNTER — Telehealth: Payer: Self-pay | Admitting: Family Medicine

## 2020-01-20 DIAGNOSIS — M810 Age-related osteoporosis without current pathological fracture: Secondary | ICD-10-CM

## 2020-01-20 DIAGNOSIS — I10 Essential (primary) hypertension: Secondary | ICD-10-CM

## 2020-01-20 NOTE — Telephone Encounter (Signed)
-----   Message from Ellamae Sia sent at 01/13/2020  2:36 PM EDT ----- Regarding: Lab orders for Friday, 7.16.21  AWV lab orders, please.

## 2020-01-21 ENCOUNTER — Other Ambulatory Visit: Payer: Self-pay

## 2020-01-21 ENCOUNTER — Other Ambulatory Visit (INDEPENDENT_AMBULATORY_CARE_PROVIDER_SITE_OTHER): Payer: PPO

## 2020-01-21 DIAGNOSIS — I10 Essential (primary) hypertension: Secondary | ICD-10-CM | POA: Diagnosis not present

## 2020-01-21 DIAGNOSIS — M810 Age-related osteoporosis without current pathological fracture: Secondary | ICD-10-CM

## 2020-01-21 LAB — COMPREHENSIVE METABOLIC PANEL
ALT: 21 U/L (ref 0–35)
AST: 22 U/L (ref 0–37)
Albumin: 4.4 g/dL (ref 3.5–5.2)
Alkaline Phosphatase: 60 U/L (ref 39–117)
BUN: 22 mg/dL (ref 6–23)
CO2: 30 mEq/L (ref 19–32)
Calcium: 9.8 mg/dL (ref 8.4–10.5)
Chloride: 99 mEq/L (ref 96–112)
Creatinine, Ser: 0.8 mg/dL (ref 0.40–1.20)
GFR: 70.09 mL/min (ref >60.00)
Glucose, Bld: 83 mg/dL (ref 70–99)
Potassium: 4 mEq/L (ref 3.5–5.1)
Sodium: 135 mEq/L (ref 135–145)
Total Bilirubin: 1.7 mg/dL — ABNORMAL HIGH (ref 0.2–1.2)
Total Protein: 6.8 g/dL (ref 6.0–8.3)

## 2020-01-21 LAB — CBC WITH DIFFERENTIAL/PLATELET
Basophils Absolute: 0 10*3/uL (ref 0.0–0.1)
Basophils Relative: 0.9 % (ref 0.0–3.0)
Eosinophils Absolute: 0.2 10*3/uL (ref 0.0–0.7)
Eosinophils Relative: 3.7 % (ref 0.0–5.0)
HCT: 40.2 % (ref 36.0–46.0)
Hemoglobin: 13.4 g/dL (ref 12.0–15.0)
Lymphocytes Relative: 36.1 % (ref 12.0–46.0)
Lymphs Abs: 1.7 10*3/uL (ref 0.7–4.0)
MCHC: 33.3 g/dL (ref 30.0–36.0)
MCV: 91.2 fl (ref 78.0–100.0)
Monocytes Absolute: 0.5 10*3/uL (ref 0.1–1.0)
Monocytes Relative: 10.4 % (ref 3.0–12.0)
Neutro Abs: 2.3 10*3/uL (ref 1.4–7.7)
Neutrophils Relative %: 48.9 % (ref 43.0–77.0)
Platelets: 194 10*3/uL (ref 150.0–400.0)
RBC: 4.41 Mil/uL (ref 3.87–5.11)
RDW: 13.2 % (ref 11.5–15.5)
WBC: 4.8 10*3/uL (ref 4.0–10.5)

## 2020-01-21 LAB — VITAMIN D 25 HYDROXY (VIT D DEFICIENCY, FRACTURES): VITD: 87.79 ng/mL (ref 30.00–100.00)

## 2020-01-21 LAB — LIPID PANEL
Cholesterol: 191 mg/dL (ref 0–200)
HDL: 79.3 mg/dL (ref >39.00)
LDL Cholesterol: 97 mg/dL (ref 0–99)
NonHDL: 111.49
Total CHOL/HDL Ratio: 2
Triglycerides: 74 mg/dL (ref 0.0–149.0)
VLDL: 14.8 mg/dL (ref 0.0–40.0)

## 2020-01-21 LAB — TSH: TSH: 2.14 u[IU]/mL (ref 0.35–4.50)

## 2020-01-28 ENCOUNTER — Other Ambulatory Visit: Payer: Self-pay

## 2020-01-28 ENCOUNTER — Ambulatory Visit (INDEPENDENT_AMBULATORY_CARE_PROVIDER_SITE_OTHER): Payer: PPO | Admitting: Family Medicine

## 2020-01-28 ENCOUNTER — Encounter: Payer: Self-pay | Admitting: Family Medicine

## 2020-01-28 VITALS — BP 109/60 | HR 62 | Temp 98.2°F | Ht 61.5 in | Wt 122.2 lb

## 2020-01-28 DIAGNOSIS — M810 Age-related osteoporosis without current pathological fracture: Secondary | ICD-10-CM

## 2020-01-28 DIAGNOSIS — I10 Essential (primary) hypertension: Secondary | ICD-10-CM | POA: Diagnosis not present

## 2020-01-28 DIAGNOSIS — Z0000 Encounter for general adult medical examination without abnormal findings: Secondary | ICD-10-CM | POA: Diagnosis not present

## 2020-01-28 DIAGNOSIS — E2839 Other primary ovarian failure: Secondary | ICD-10-CM

## 2020-01-28 MED ORDER — LISINOPRIL-HYDROCHLOROTHIAZIDE 10-12.5 MG PO TABS: 0.5000 | ORAL_TABLET | Freq: Every day | ORAL | 3 refills | Status: DC

## 2020-01-28 NOTE — Progress Notes (Signed)
Subjective:    Patient ID: Marie Kennedy, female    DOB: [birth year], 74 y.o.   MRN: [id number]  This visit occurred during the SARS-CoV-2 public health emergency.  Safety protocols were in place, including screening questions prior to the visit, additional usage of staff PPE, and extensive cleaning of exam room while observing appropriate contact time as indicated for disinfecting solutions.    HPI Pt presents for amw and health mt exam   I have personally reviewed the Medicare Annual Wellness questionnaire and have noted 1. The patient's medical and social history 2. Their use of alcohol, tobacco or illicit drugs 3. Their current medications and supplements 4. The patient's functional ability including ADL's, fall risks, home safety risks and hearing or visual             impairment. 5. Diet and physical activities 6. Evidence for depression or mood disorders  The patients weight, height, BMI have been recorded in the chart and visual acuity is per eye clinic.  I have made referrals, counseling and provided education to the patient based review of the above and I have provided the pt with a written personalized care plan for preventive services. Reviewed and updated provider list, see scanned forms.  See scanned forms.  Routine anticipatory guidance given to patient.  See health maintenance. Colon cancer screening-colonoscopy 4/21, normal Breast cancer screening-mammogram 8/20- will schedule that  Self breast exam-no lumps  Mother had breast cancer Flu vaccine- 9/20 covid vaccine-done Tetanus vaccine - Td 12/13 Pneumovax-completed Zoster vaccine-had shingrix  Hep c neg 2/21 Dexa 7/19 -osteoporosis  Lost 1/2 inch  Declines treatment  Falls- none Fractures-none  Supplements- vit D3 Exercise -walking 3 mi daily   Advance directive-given the materials to work on  Cognitive function addressed- see scanned forms- and if abnormal then additional documentation follows.  No  concerns about memory or cognition  She watches this carefully   Care team  Shellee Streng- pcp [facility]  PMH and SH reviewed  Meds, vitals, and allergies reviewed.   ROS: See HPI.  Otherwise negative.    Weight : Wt Readings from Last 3 Encounters:  01/28/20 122 lb 4 oz (55.5 kg)  11/04/19 125 lb (56.7 kg)  10/21/19 125 lb (56.7 kg)   22.72 kg/m   Hearing/vision:  Hearing Screening   125Hz  250Hz  500Hz  1000Hz  2000Hz  3000Hz  4000Hz  6000Hz  8000Hz   Right ear:   0 0 40  40    Left ear:   0 0 0  0    Vision Screening Comments: Pt had eye exam in Aug 2020 and next eye exam at [place] of her hearing loss  She is not ready for further eval or hearing aides    HTN bp is stable today  No cp or palpitations or headaches or edema  No side effects to medicines  BP Readings from Last 3 Encounters:  01/28/20 (!) 109/60  11/04/19 (!) 146/70  01/13/19 114/72      Pulse Readings from Last 3 Encounters:  01/28/20 62  11/04/19 68  01/13/19 70   Bilirubin is 1.7-this is always mildly elevated Lab Results  Component Value Date   CREATININE 0.80 01/21/2020   BUN 22 01/21/2020   NA 135 01/21/2020   K 4.0 01/21/2020   CL 99 01/21/2020   CO2 30 01/21/2020   Lab Results  Component Value Date   ALT 21 01/21/2020   AST 22 01/21/2020   ALKPHOS 60 01/21/2020  BILITOT 1.7 (H) 01/21/2020    Cholesterol Lab Results  Component Value Date   CHOL 191 01/21/2020   CHOL 180 12/30/2018   CHOL 179 12/10/2017   Lab Results  Component Value Date   HDL 79.30 01/21/2020   HDL 79.00 12/30/2018   HDL 79.60 12/10/2017   Lab Results  Component Value Date   LDLCALC 97 01/21/2020   LDLCALC 84 12/30/2018   LDLCALC 89 12/10/2017   Lab Results  Component Value Date   TRIG 74.0 01/21/2020   TRIG 84.0 12/30/2018   TRIG 50.0 12/10/2017   Lab Results  Component Value Date   CHOLHDL 2 01/21/2020   CHOLHDL 2 12/30/2018   CHOLHDL 2  12/10/2017   No results found for: LDLDIRECT  Eats a healthy diet  No bacon/sausage   Patient Active Problem List   Diagnosis Date Noted  . Medicare annual wellness visit, subsequent 01/28/2020  . Routine general medical examination at a health care facility 11/22/2015  . Estrogen deficiency 11/22/2015  . Exposure to communicable disease 11/22/2015  . Personal history of other malignant neoplasm of skin 06/01/2013  . Colon cancer screening 06/22/2012  . Hyperbilirubinemia 06/22/2012  . Osteoporosis 12/12/2010  . Post-menopausal 12/12/2010  . Screening mammogram, encounter for 12/04/2010  . ALLERGIC RHINITIS, SEASONAL 11/13/2009  . DEPRESSION 05/04/2007  . Essential hypertension 05/04/2007  . IBS 05/04/2007  . ROSACEA 05/04/2007   Past Medical History:  Diagnosis Date  . Allergy   . Basal cell adenoma 11/14   face  . Basal cell carcinoma 03/11/2013   Right forehead. Nodular pattern. Excised: 05/14/2016  . Cancer ([place])    skin  . Fibroids 1993   cysts endometriosis  . GERD (gastroesophageal reflux disease)   . Hx of basal cell carcinoma 03/11/2013   Right ala of nose. BCC with sclerosis  . Hx of basal cell carcinoma 01/05/2015   Left lateral proximal elbow. Superficial.  . Hypertension    PCP   [facility] Glori Bickers [facility]  . IBS (irritable bowel syndrome)   . Rosacea    Past Surgical History:  Procedure Laterality Date  . ABDOMINAL HYSTERECTOMY  1993  . BASAL CELL CARCINOMA EXCISION  11/14   face  . BREAST EXCISIONAL BIOPSY Right 1970   Negative  . BREAST SURGERY Right 1970's   lumpectomy no cancer  . CATARACT EXTRACTION W/PHACO Left 05/19/2017   Procedure: CATARACT EXTRACTION PHACO AND INTRAOCULAR LENS PLACEMENT ([place]) LEFT;  Surgeon: Leandrew Koyanagi, MD;  Location: [facility];  Service: Ophthalmology;  Laterality: Left;  . CATARACT EXTRACTION W/PHACO Right 06/18/2017   Procedure: CATARACT EXTRACTION PHACO AND INTRAOCULAR LENS PLACEMENT ([place])   COMPLICATED  RIGHT;  Surgeon: Leandrew Koyanagi, MD;  Location: [facility];  Service: Ophthalmology;  Laterality: Right;  . CHOLECYSTECTOMY  06/18/2011   Procedure: LAPAROSCOPIC CHOLECYSTECTOMY WITH INTRAOPERATIVE CHOLANGIOGRAM;  Surgeon: Joyice Faster. Cornett, MD;  Location: [facility];  Service: General;  Laterality: N/A;  lap chole with IOC  . OOPHORECTOMY    . TONSILLECTOMY  1953   Social History   Tobacco Use  . Smoking status: Never Smoker  . Smokeless tobacco: Never Used  Vaping Use  . Vaping Use: Never used  Substance Use Topics  . Alcohol use: No    Alcohol/week: 0.0 standard drinks  . Drug use: No   Family History  Problem Relation Age of Onset  . Hypertension Mother   . Cancer Mother        breast, uterine, and cervical  .  Breast cancer Mother 2  . Heart disease Father        CAD  . Hypertension Father   . Diabetes Father        TYPE II  . Cancer Father        brain  . Colon cancer Neg Hx   . Esophageal cancer Neg Hx   . Rectal cancer Neg Hx   . Stomach cancer Neg Hx    No Known Allergies Current Outpatient Medications on File Prior to Visit  Medication Sig Dispense Refill  . Calcium Carbonate-Vit D-Min 600-400 MG-UNIT TABS Take 1 tablet by mouth daily.     . Cholecalciferol (VITAMIN D3 PO) Take 4,000 Units by mouth daily.    . clotrimazole-betamethasone (LOTRISONE) cream APPLY TWICE A DAY TO AFFECTED FINGERS AS NEEDED    . DiphenhydrAMINE HCl (ALLERGY MED PO) Take 25 mg by mouth daily as needed. Allergies     . hyoscyamine (LEVSIN SL) 0.125 MG SL tablet Place 1 tablet (0.125 mg total) under the tongue 2 (two) times daily as needed for cramping. Abdominal cramping 30 tablet 11  . metroNIDAZOLE (METROCREAM) 0.75 % cream Apply 1 application topically daily. Rosacea 135 g 3   No current facility-administered medications on file prior to visit.     Review of Systems  Constitutional: Negative for activity change, appetite change, fatigue, fever and  unexpected weight change.  HENT: Negative for congestion, ear pain, rhinorrhea, sinus pressure and sore throat.   Eyes: Negative for pain, redness and visual disturbance.  Respiratory: Negative for cough, shortness of breath and wheezing.   Cardiovascular: Negative for chest pain and palpitations.  Gastrointestinal: Negative for abdominal pain, blood in stool, constipation and diarrhea.  Endocrine: Negative for polydipsia and polyuria.  Genitourinary: Negative for dysuria, frequency and urgency.  Musculoskeletal: Negative for arthralgias, back pain and myalgias.  Skin: Negative for pallor and rash.  Allergic/Immunologic: Negative for environmental allergies.  Neurological: Negative for dizziness, syncope and headaches.  Hematological: Negative for adenopathy. Does not bruise/bleed easily.  Psychiatric/Behavioral: Negative for decreased concentration and dysphoric mood. The patient is not nervous/anxious.        Objective:   Physical Exam Constitutional:      General: She is not in acute distress.    Appearance: Normal appearance. She is well-developed and normal weight. She is not ill-appearing or diaphoretic.  HENT:     Head: Normocephalic and atraumatic.     Right Ear: Tympanic membrane, ear canal and external ear normal.     Left Ear: Tympanic membrane, ear canal and external ear normal.     Nose: Nose normal. No congestion.     Mouth/Throat:     Mouth: Mucous membranes are moist.     Pharynx: Oropharynx is clear. No posterior oropharyngeal erythema.  Eyes:     General: No scleral icterus.    Extraocular Movements: Extraocular movements intact.     Conjunctiva/sclera: Conjunctivae normal.     Pupils: Pupils are equal, round, and reactive to light.  Neck:     Thyroid: No thyromegaly.     Vascular: No carotid bruit or JVD.  Cardiovascular:     Rate and Rhythm: Normal rate and regular rhythm.     Pulses: Normal pulses.     Heart sounds: Normal heart sounds. No gallop.     Pulmonary:     Effort: Pulmonary effort is normal. No respiratory distress.     Breath sounds: Normal breath sounds. No wheezing.     Comments: Good  air exch Chest:     Chest wall: No tenderness.  Abdominal:     General: Bowel sounds are normal. There is no distension or abdominal bruit.     Palpations: Abdomen is soft. There is no mass.     Tenderness: There is no abdominal tenderness.     Hernia: No hernia is present.  Genitourinary:    Comments: Breast exam: No mass, nodules, thickening, tenderness, bulging, retraction, inflamation, nipple discharge or skin changes noted.  No axillary or clavicular LA.     Musculoskeletal:        General: No tenderness. Normal range of motion.     Cervical back: Normal range of motion and neck supple. No rigidity. No muscular tenderness.     Right lower leg: No edema.     Left lower leg: No edema.     Comments: No kyphosis   Lymphadenopathy:     Cervical: No cervical adenopathy.  Skin:    General: Skin is warm and dry.     Coloration: Skin is not pale.     Findings: No erythema or rash.     Comments: Some sks  Fair complexion   Neurological:     Mental Status: She is alert. Mental status is at baseline.     Cranial Nerves: No cranial nerve deficit.     Motor: No abnormal muscle tone.     Coordination: Coordination normal.     Gait: Gait normal.     Deep Tendon Reflexes: Reflexes are normal and symmetric. Reflexes normal.  Psychiatric:        Mood and Affect: Mood normal.        Cognition and Memory: Cognition and memory normal.           Assessment & Plan:   Problem List Items Addressed This Visit      Cardiovascular and Mediastinum   Essential hypertension    bp in fair control at this time  BP Readings from Last 1 Encounters:  01/28/20 (!) 109/60   No changes needed Most recent labs reviewed  Disc lifstyle change with low sodium diet and exercise        Relevant Medications   lisinopril-hydrochlorothiazide  (ZESTORETIC) 10-12.5 MG tablet     Musculoskeletal and Integument   Osteoporosis    Due for dexa -last 7/19 Ref made/she will schedule  losat ht, no falls or fx Declines tx so far takign D3 and exercising         Other   Hyperbilirubinemia    Stable/no changes       Routine general medical examination at a health care facility    Reviewed health habits including diet and exercise and skin cancer prevention Reviewed appropriate screening tests for age  Also reviewed health mt list, fam hx and immunization status , as well as social and family history   See HPI Labs reviewed  Pt will schedule mammogram and dexa next mo  No falls or fractures  Had covid vaccine and shingrix Given materials to work on advance directive No cognitive concerns -mentally sharp No change in care team  Poor hearing on screen-pt declines audiology eval or tx  Up to date vision/eye screen       Estrogen deficiency   Relevant Orders   DG Bone Density   Medicare annual wellness visit, subsequent - Primary    Reviewed health habits including diet and exercise and skin cancer prevention Reviewed appropriate screening tests for age  Also reviewed health mt  list, fam hx and immunization status , as well as social and family history   See HPI Labs reviewed  Pt will schedule mammogram and dexa next mo  No falls or fractures  Had covid vaccine and shingrix Given materials to work on advance directive No cognitive concerns -mentally sharp No change in care team  Poor hearing on screen-pt declines audiology eval or tx  Up to date vision/eye screen

## 2020-01-28 NOTE — Patient Instructions (Addendum)
Don't forget to schedule your mammogram and bone density appt.  Please work on advance directive - the blue packet   If/when you are ready for a comprehensive hearing eval please call

## 2020-01-30 ENCOUNTER — Encounter: Payer: Self-pay | Admitting: Family Medicine

## 2020-01-30 NOTE — Assessment & Plan Note (Signed)
bp in fair control at this time  BP Readings from Last 1 Encounters:  01/28/20 (!) 109/60   No changes needed Most recent labs reviewed  Disc lifstyle change with low sodium diet and exercise

## 2020-01-30 NOTE — Assessment & Plan Note (Signed)
Reviewed health habits including diet and exercise and skin cancer prevention Reviewed appropriate screening tests for age  Also reviewed health mt list, fam hx and immunization status , as well as social and family history   See HPI Labs reviewed  Pt will schedule mammogram and dexa next mo  No falls or fractures  Had covid vaccine and shingrix Given materials to work on advance directive No cognitive concerns -mentally sharp No change in care team  Poor hearing on screen-pt declines audiology eval or tx  Up to date vision/eye screen

## 2020-01-30 NOTE — Assessment & Plan Note (Signed)
Stable -no changes  

## 2020-01-30 NOTE — Assessment & Plan Note (Signed)
Due for dexa -last 7/19 Ref made/she will schedule  losat ht, no falls or fx Declines tx so far takign D3 and exercising

## 2020-02-01 ENCOUNTER — Other Ambulatory Visit: Payer: Self-pay

## 2020-02-01 ENCOUNTER — Other Ambulatory Visit: Payer: Self-pay | Admitting: Family Medicine

## 2020-02-01 DIAGNOSIS — Z1231 Encounter for screening mammogram for malignant neoplasm of breast: Secondary | ICD-10-CM

## 2020-02-29 ENCOUNTER — Other Ambulatory Visit: Payer: Self-pay

## 2020-02-29 ENCOUNTER — Ambulatory Visit
Admission: RE | Admit: 2020-02-29 | Discharge: 2020-02-29 | Disposition: A | Discharge status: Home / Self Care | Payer: PPO | Source: Ambulatory Visit | Attending: Family Medicine | Admitting: Family Medicine

## 2020-02-29 DIAGNOSIS — Z1231 Encounter for screening mammogram for malignant neoplasm of breast: Secondary | ICD-10-CM | POA: Diagnosis not present

## 2020-02-29 DIAGNOSIS — M8589 Other specified disorders of bone density and structure, multiple sites: Secondary | ICD-10-CM | POA: Diagnosis not present

## 2020-02-29 DIAGNOSIS — Z90722 Acquired absence of ovaries, bilateral: Secondary | ICD-10-CM | POA: Diagnosis not present

## 2020-02-29 DIAGNOSIS — R29890 Loss of height: Secondary | ICD-10-CM | POA: Diagnosis not present

## 2020-02-29 DIAGNOSIS — Z8262 Family history of osteoporosis: Secondary | ICD-10-CM | POA: Diagnosis not present

## 2020-02-29 DIAGNOSIS — E2839 Other primary ovarian failure: Secondary | ICD-10-CM | POA: Diagnosis not present

## 2020-03-03 DIAGNOSIS — Z961 Presence of intraocular lens: Secondary | ICD-10-CM | POA: Diagnosis not present

## 2020-03-27 DIAGNOSIS — H26493 Other secondary cataract, bilateral: Secondary | ICD-10-CM | POA: Diagnosis not present

## 2020-03-31 ENCOUNTER — Other Ambulatory Visit: Payer: Self-pay | Admitting: Family Medicine

## 2020-03-31 NOTE — Telephone Encounter (Signed)
Combo not available Rx sent for 2 separate Rxs

## 2020-04-22 ENCOUNTER — Other Ambulatory Visit: Payer: Self-pay

## 2020-04-22 ENCOUNTER — Ambulatory Visit (INDEPENDENT_AMBULATORY_CARE_PROVIDER_SITE_OTHER): Payer: PPO

## 2020-04-22 DIAGNOSIS — Z23 Encounter for immunization: Secondary | ICD-10-CM

## 2020-05-29 ENCOUNTER — Other Ambulatory Visit: Payer: Self-pay

## 2020-05-29 DIAGNOSIS — L719 Rosacea, unspecified: Secondary | ICD-10-CM

## 2020-05-29 MED ORDER — METRONIDAZOLE 0.75 % EX CREA: 1.0000 "application " | TOPICAL_CREAM | Freq: Every day | CUTANEOUS | 2 refills | Status: DC

## 2020-05-29 NOTE — Progress Notes (Signed)
rx refill

## 2020-06-09 DIAGNOSIS — Z20822 Contact with and (suspected) exposure to covid-19: Secondary | ICD-10-CM | POA: Diagnosis not present

## 2020-06-23 ENCOUNTER — Other Ambulatory Visit: Payer: Self-pay | Admitting: Dermatology

## 2020-10-12 ENCOUNTER — Encounter: Payer: PPO | Admitting: Dermatology

## 2021-01-29 ENCOUNTER — Other Ambulatory Visit: Payer: Self-pay

## 2021-01-29 ENCOUNTER — Ambulatory Visit: Payer: PPO | Admitting: Dermatology

## 2021-01-29 DIAGNOSIS — D1800 Hemangioma unspecified site: Secondary | ICD-10-CM | POA: Diagnosis not present

## 2021-01-29 DIAGNOSIS — D229 Melanocytic nevi, unspecified: Secondary | ICD-10-CM | POA: Diagnosis not present

## 2021-01-29 DIAGNOSIS — Z85828 Personal history of other malignant neoplasm of skin: Secondary | ICD-10-CM

## 2021-01-29 DIAGNOSIS — L578 Other skin changes due to chronic exposure to nonionizing radiation: Secondary | ICD-10-CM | POA: Diagnosis not present

## 2021-01-29 DIAGNOSIS — L720 Epidermal cyst: Secondary | ICD-10-CM

## 2021-01-29 DIAGNOSIS — L821 Other seborrheic keratosis: Secondary | ICD-10-CM

## 2021-01-29 DIAGNOSIS — L814 Other melanin hyperpigmentation: Secondary | ICD-10-CM | POA: Diagnosis not present

## 2021-01-29 DIAGNOSIS — Z1283 Encounter for screening for malignant neoplasm of skin: Secondary | ICD-10-CM

## 2021-01-29 NOTE — Progress Notes (Signed)
   Follow-Up Visit   Subjective  Marie Kennedy is a 75 y.o. female who presents for the following: Annual Exam. Hx of BCC. Pt c/o irritated growth on her scalp she would like to have removed.  The patient presents for Total-Body Skin Exam (TBSE) for skin cancer screening and mole check.   The following portions of the chart were reviewed this encounter and updated as appropriate:   Tobacco  Allergies  Meds  Problems  Med Hx  Surg Hx  Fam Hx     Review of Systems:  No other skin or systemic complaints except as noted in HPI or Assessment and Plan.  Objective  Well appearing patient in no apparent distress; mood and affect are within normal limits.  A full examination was performed including scalp, head, eyes, ears, nose, lips, neck, chest, axillae, abdomen, back, buttocks, bilateral upper extremities, bilateral lower extremities, hands, feet, fingers, toes, fingernails, and toenails. All findings within normal limits unless otherwise noted below.  left vertex scalp 1.5 cm Subcutaneous nodule.    Assessment & Plan  Epidermal cyst left vertex scalp  Benign-appearing. Exam most consistent with an epidermal inclusion cyst. Discussed that a cyst is a benign growth that can grow over time and sometimes get irritated or inflamed. Recommend observation if it is not bothersome. Discussed option of surgical excision to remove it if it is growing, symptomatic, or other changes noted. Please call for new or changing lesions so they can be evaluated.   Return to the office for surgery removal   Skin cancer screening  Lentigines - Scattered tan macules - Due to sun exposure - Benign-appering, observe - Recommend daily broad spectrum sunscreen SPF 30+ to sun-exposed areas, reapply every 2 hours as needed. - Call for any changes  Seborrheic Keratoses - Stuck-on, waxy, tan-brown papules and/or plaques  - Benign-appearing - Discussed benign etiology and prognosis. - Observe - Call for  any changes  Melanocytic Nevi - Tan-brown and/or pink-flesh-colored symmetric macules and papules - Benign appearing on exam today - Observation - Call clinic for new or changing moles - Recommend daily use of broad spectrum spf 30+ sunscreen to sun-exposed areas.   Hemangiomas - Red papules - Discussed benign nature - Observe - Call for any changes  Actinic Damage - Chronic condition, secondary to cumulative UV/sun exposure - diffuse scaly erythematous macules with underlying dyspigmentation - Recommend daily broad spectrum sunscreen SPF 30+ to sun-exposed areas, reapply every 2 hours as needed.  - Staying in the shade or wearing long sleeves, sun glasses (UVA+UVB protection) and wide brim hats (4-inch brim around the entire circumference of the hat) are also recommended for sun protection.  - Call for new or changing lesions.  History of Basal Cell Carcinoma of the Skin Multiple see history  - No evidence of recurrence today - Recommend regular full body skin exams - Recommend daily broad spectrum sunscreen SPF 30+ to sun-exposed areas, reapply every 2 hours as needed.  - Call if any new or changing lesions are noted between office visits   Skin cancer screening performed today.   Return in about 1 year (around 01/29/2022) for TBSE, Return for cyst surgery .  IMarye Round, CMA, am acting as scribe for Sarina Ser, MD .  Documentation: I have reviewed the above documentation for accuracy and completeness, and I agree with the above.  Sarina Ser, MD

## 2021-01-29 NOTE — Patient Instructions (Addendum)
Pre-Operative Instructions  You are scheduled for a surgical procedure at [facility]. We recommend you read the following instructions. If you have any questions or concerns, please call the office at [phone].  Shower and wash the entire body with soap and water the day of your surgery paying special attention to cleansing at and around the planned surgery site.  Avoid aspirin or aspirin containing products at least fourteen (14) days prior to your surgical procedure and for at least one week (7 Days) after your surgical procedure. If you take aspirin on a regular basis for heart disease or history of stroke or for any other reason, we may recommend you continue taking aspirin but please notify us if you take this on a regular basis. Aspirin can cause more bleeding to occur during surgery as well as prolonged bleeding and bruising after surgery.   Avoid other nonsteroidal pain medications at least one week prior to surgery and at least one week prior to your surgery. These include medications such as Ibuprofen (Motrin, Advil and Nuprin), Naprosyn, Voltaren, Relafen, etc. If medications are used for therapeutic reasons, please inform us as they can cause increased bleeding or prolonged bleeding during and bruising after surgical procedures.   Please advise [place] if you are taking any "blood thinner" medications such as Coumadin or Dipyridamole or Plavix or similar medications. These cause increased bleeding and prolonged bleeding during procedures and bruising after surgical procedures. We may have to consider discontinuing these medications briefly prior to and shortly after your surgery if safe to do so.   Please inform us of all medications you are currently taking. All medications that are taken regularly should be taken the day of surgery as you always do. Nevertheless, we need to be informed of what medications you are taking prior to surgery to know whether they will affect the  procedure or cause any complications.   Please inform us of any medication allergies. Also inform us of whether you have allergies to Latex or rubber products or whether you have had any adverse reaction to Lidocaine or Epinephrine.  Please inform us of any prosthetic or artificial body parts such as artificial heart valve, joint replacements, etc., or similar condition that might require preoperative antibiotics.   We recommend avoidance of alcohol at least two weeks prior to surgery and continued avoidance for at least two weeks after surgery.   We recommend discontinuation of tobacco smoking at least two weeks prior to surgery and continued abstinence for at least two weeks after surgery.  Do not plan strenuous exercise, strenuous work or strenuous lifting for approximately four weeks after your surgery.   We request if you are unable to make your scheduled surgical appointment, please call us at least a week in advance or as soon as you are aware of a problem so that we can cancel or reschedule the appointment.   You MAY TAKE TYLENOL (acetaminophen) for pain as it is not a blood thinner.   PLEASE PLAN TO BE IN TOWN FOR TWO WEEKS FOLLOWING SURGERY, THIS IS IMPORTANT SO YOU CAN BE CHECKED FOR DRESSING CHANGES, SUTURE REMOVAL AND TO MONITOR FOR POSSIBLE COMPLICATIONS.     If you have any questions or concerns for your doctor, please call our main line at [phone] and press option 4 to reach your doctor's medical assistant. If no one answers, please leave a voicemail as directed and we will return your call as soon as possible. Messages left after 4 pm  will be answered the following business day.   You may also send [place] a message via [place]. We typically respond to MyChart messages within 1-2 business days.  For prescription refills, please ask your pharmacy to contact our office. Our fax number is [phone].  If you have an urgent issue when the clinic is closed that cannot wait  until the next business day, you can page your doctor at the number below.    Please note that while we do our best to be available for urgent issues outside of office hours, we are not available 24/7.   If you have an urgent issue and are unable to reach [place], you may choose to seek medical care at your doctor's office, retail clinic, urgent care center, or emergency room.  If you have a medical emergency, please immediately call 911 or go to the emergency department.  Pager Numbers  - Dr. Nehemiah Massed: [phone]  - Dr. Laurence Ferrari: [phone]  - Dr. Nicole Kindred: [phone]  In the event of inclement weather, please call our main line at [phone] for an update on the status of any delays or closures.  Dermatology Medication Tips: Please keep the boxes that topical medications come in in order to help keep track of the instructions about where and how to use these. Pharmacies typically print the medication instructions only on the boxes and not directly on the medication tubes.   If your medication is too expensive, please contact our office at [phone] option 4 or send [place] a message through [place].   We are unable to tell what your co-pay for medications will be in advance as this is different depending on your insurance coverage. However, we may be able to find a substitute medication at lower cost or fill out paperwork to get insurance to cover a needed medication.   If a prior authorization is required to get your medication covered by your insurance company, please allow [place] 1-2 business days to complete this process.  Drug prices often vary depending on where the prescription is filled and some pharmacies may offer cheaper prices.  The website www.goodrx.com contains coupons for medications through different pharmacies. The prices here do not account for what the cost may be with help from insurance (it may be cheaper with your insurance), but the website can give you the price if you  did not use any insurance.  - You can print the associated coupon and take it with your prescription to the pharmacy.  - You may also stop by our office during regular business hours and pick up a GoodRx coupon card.  - If you need your prescription sent electronically to a different pharmacy, notify our office through [facility] or by phone at [phone] option 4.

## 2021-01-30 ENCOUNTER — Encounter: Payer: Self-pay | Admitting: Dermatology

## 2021-02-27 ENCOUNTER — Encounter: Payer: Self-pay | Admitting: Dermatology

## 2021-02-27 ENCOUNTER — Ambulatory Visit: Payer: PPO | Admitting: Dermatology

## 2021-02-27 ENCOUNTER — Telehealth: Payer: Self-pay

## 2021-02-27 ENCOUNTER — Other Ambulatory Visit: Payer: Self-pay

## 2021-02-27 DIAGNOSIS — L7211 Pilar cyst: Secondary | ICD-10-CM | POA: Diagnosis not present

## 2021-02-27 DIAGNOSIS — D492 Neoplasm of unspecified behavior of bone, soft tissue, and skin: Secondary | ICD-10-CM

## 2021-02-27 MED ORDER — MUPIROCIN 2 % EX OINT: 1.0000 "application " | TOPICAL_OINTMENT | Freq: Every day | CUTANEOUS | 1 refills | Status: DC

## 2021-02-27 NOTE — Patient Instructions (Signed)
Wound Care Instructions  Cleanse wound gently with soap and water once a day then pat dry with clean gauze. Apply a thing coat of Petrolatum (petroleum jelly, "Vaseline") over the wound (unless you have an allergy to this). We recommend that you use a new, sterile tube of Vaseline. Do not pick or remove scabs. Do not remove the yellow or white "healing tissue" from the base of the wound.  Cover the wound with fresh, clean, nonstick gauze and secure with paper tape. You may use Band-Aids in place of gauze and tape if the would is small enough, but would recommend trimming much of the tape off as there is often too much. Sometimes Band-Aids can irritate the skin.  You should call the office for your biopsy report after 1 week if you have not already been contacted.  If you experience any problems, such as abnormal amounts of bleeding, swelling, significant bruising, significant pain, or evidence of infection, please call the office immediately.  FOR ADULT SURGERY PATIENTS: If you need something for pain relief you may take 1 extra strength Tylenol (acetaminophen) AND 2 Ibuprofen (200mg each) together every 4 hours as needed for pain. (do not take these if you are allergic to them or if you have a reason you should not take them.) Typically, you may only need pain medication for 1 to 3 days.   If you have any questions or concerns for your doctor, please call our main line at [phone] and press option 4 to reach your doctor's medical assistant. If no one answers, please leave a voicemail as directed and we will return your call as soon as possible. Messages left after 4 pm will be answered the following business day.   You may also send us a message via MyChart. We typically respond to MyChart messages within 1-2 business days.  For prescription refills, please ask your pharmacy to contact our office. Our fax number is [phone].  If you have an urgent issue when the clinic is closed that  cannot wait until the next business day, you can page your doctor at the number below.    Please note that while we do our best to be available for urgent issues outside of office hours, we are not available 24/7.   If you have an urgent issue and are unable to reach us, you may choose to seek medical care at your doctor's office, retail clinic, urgent care center, or emergency room.  If you have a medical emergency, please immediately call 911 or go to the emergency department.  Pager Numbers  - Dr. Kowalski: [phone]  - Dr. Moye: [phone]  - Dr. Stewart: [phone]  In the event of inclement weather, please call our main line at [phone] for an update on the status of any delays or closures.  Dermatology Medication Tips: Please keep the boxes that topical medications come in in order to help keep track of the instructions about where and how to use these. Pharmacies typically print the medication instructions only on the boxes and not directly on the medication tubes.   If your medication is too expensive, please contact our office at [phone] option 4 or send us a message through MyChart.   We are unable to tell what your co-pay for medications will be in advance as this is different depending on your insurance coverage. However, we may be able to find a substitute medication at lower cost or fill out paperwork to get insurance to cover a needed   medication.   If a prior authorization is required to get your medication covered by your insurance company, please allow us 1-2 business days to complete this process.  Drug prices often vary depending on where the prescription is filled and some pharmacies may offer cheaper prices.  The website www.goodrx.com contains coupons for medications through different pharmacies. The prices here do not account for what the cost may be with help from insurance (it may be cheaper with your insurance), but the website can give you the  price if you did not use any insurance.  - You can print the associated coupon and take it with your prescription to the pharmacy.  - You may also stop by our office during regular business hours and pick up a GoodRx coupon card.  - If you need your prescription sent electronically to a different pharmacy, notify our office through [facility] MyChart or by phone at [phone] option 4.   

## 2021-02-27 NOTE — Progress Notes (Signed)
   Follow-Up Visit   Subjective  Marie Kennedy is a 75 y.o. female who presents for the following: cyst vs other (L vertex scalp, pt presents for excision).  The following portions of the chart were reviewed this encounter and updated as appropriate:   Tobacco  Allergies  Meds  Problems  Med Hx  Surg Hx  Fam Hx     Review of Systems:  No other skin or systemic complaints except as noted in HPI or Assessment and Plan.  Objective  Well appearing patient in no apparent distress; mood and affect are within normal limits.  A focused examination was performed including scalp. Relevant physical exam findings are noted in the Assessment and Plan.  L vertex scalp Cystic pap 2.2cm   Assessment & Plan  Neoplasm of skin L vertex scalp  Skin excision  Lesion length (cm):  2.2 Lesion width (cm):  2.2 Margin per side (cm):  0 Total excision diameter (cm):  2.2 Informed consent: discussed and consent obtained   Timeout: patient name, date of birth, surgical site, and procedure verified   Procedure prep:  Patient was prepped and draped in usual sterile fashion Prep type:  Isopropyl alcohol and povidone-iodine Anesthesia: the lesion was anesthetized in a standard fashion   Anesthetic:  1% lidocaine w/ epinephrine 1-100,000 buffered w/ 8.4% NaHCO3 (3.0cc) Instrument used comment:  #15c blade Hemostasis achieved with: pressure   Hemostasis achieved with comment:  Electrocautery Outcome: patient tolerated procedure well with no complications   Post-procedure details: sterile dressing applied and wound care instructions given   Dressing type: bandage and pressure dressing (Mupirocin)    Skin repair Complexity:  Complex Final length (cm):  2.2 Reason for type of repair: reduce tension to allow closure, reduce the risk of dehiscence, infection, and necrosis, reduce subcutaneous dead space and avoid a hematoma, allow closure of the large defect, preserve normal anatomy, preserve normal  anatomical and functional relationships and enhance both functionality and cosmetic results   Undermining: area extensively undermined   Undermining comment:  Undermining Defect 2.2cm Subcutaneous layers (deep stitches):  Subcutaneous suture technique: Inverted Dermal. Fine/surface layer approximation (top stitches):  Suture size:  3-0 Suture type: nylon   Stitches: horizontal mattress and simple interrupted   Suture removal (days):  7 Hemostasis achieved with: pressure Outcome: patient tolerated procedure well with no complications   Post-procedure details: sterile dressing applied and wound care instructions given   Dressing type: bandage, pressure dressing and bacitracin (Mupirocin)    mupirocin ointment (BACTROBAN) 2 % Apply 1 application topically daily. Qd to excision site  Specimen 1 - Surgical pathology Differential Diagnosis: D48.5 Cyst vs other  Check Margins: No Cystic pap 2.2cm  Cyst vs other  Start Mupirocin ont qd to excision site  Return in about 1 week (around 03/06/2021) for suture removal.  I, Othelia Pulling, RMA, am acting as scribe for Sarina Ser, MD . Documentation: I have reviewed the above documentation for accuracy and completeness, and I agree with the above.  Sarina Ser, MD

## 2021-02-27 NOTE — Telephone Encounter (Signed)
Pt doing fine after today's surgery./sh 

## 2021-03-06 ENCOUNTER — Ambulatory Visit (INDEPENDENT_AMBULATORY_CARE_PROVIDER_SITE_OTHER): Payer: PPO | Admitting: Dermatology

## 2021-03-06 ENCOUNTER — Telehealth: Payer: Self-pay

## 2021-03-06 ENCOUNTER — Other Ambulatory Visit: Payer: Self-pay

## 2021-03-06 ENCOUNTER — Encounter: Payer: Self-pay | Admitting: Dermatology

## 2021-03-06 DIAGNOSIS — Z4802 Encounter for removal of sutures: Secondary | ICD-10-CM

## 2021-03-06 DIAGNOSIS — L7211 Pilar cyst: Secondary | ICD-10-CM

## 2021-03-06 NOTE — Telephone Encounter (Signed)
-----   Message from Ralene Bathe, MD sent at 03/01/2021  5:11 PM EDT ----- Diagnosis Skin (M), L vertex scalp EXCISION, PILAR CYST  Benign cyst

## 2021-03-06 NOTE — Patient Instructions (Signed)

## 2021-03-06 NOTE — Progress Notes (Signed)
   Follow-Up Visit   Subjective  Marie Kennedy is a 75 y.o. female who presents for the following: Pilar cyst bx proven (L vertex scalp, pt presents for suture removal).   The following portions of the chart were reviewed this encounter and updated as appropriate:   Tobacco  Allergies  Meds  Problems  Med Hx  Surg Hx  Fam Hx      Review of Systems:  No other skin or systemic complaints except as noted in HPI or Assessment and Plan.  Objective  Well appearing patient in no apparent distress; mood and affect are within normal limits.  A focused examination was performed including scalp. Relevant physical exam findings are noted in the Assessment and Plan.  L vertex scalp Healing excision site   Assessment & Plan  Pilar cyst L vertex scalp  Bx proven, healing well  Encounter for Removal of Sutures - Incision site at the L vertex scalp is clean, dry and intact - Wound cleansed, sutures removed, wound cleansed and steri strips applied.  - Discussed pathology results showing pilar cyst  - Patient advised to keep steri-strips dry until they fall off. - Scars remodel for a full year. - Once steri-strips fall off, patient can apply over-the-counter silicone scar cream each night to help with scar remodeling if desired. - Patient advised to call with any concerns or if they notice any new or changing lesions.   Return for as scheduled.  I, Othelia Pulling, RMA, am acting as scribe for Sarina Ser, MD . Documentation: I have reviewed the above documentation for accuracy and completeness, and I agree with the above.  Sarina Ser, MD

## 2021-03-06 NOTE — Telephone Encounter (Signed)
Discussed biopsy results with pt  °

## 2021-03-20 ENCOUNTER — Other Ambulatory Visit: Payer: Self-pay | Admitting: Family Medicine

## 2021-03-27 ENCOUNTER — Telehealth: Payer: Self-pay | Admitting: Family Medicine

## 2021-03-27 DIAGNOSIS — I10 Essential (primary) hypertension: Secondary | ICD-10-CM

## 2021-03-27 DIAGNOSIS — M810 Age-related osteoporosis without current pathological fracture: Secondary | ICD-10-CM

## 2021-03-27 NOTE — Telephone Encounter (Signed)
-----   Message from Ellamae Sia sent at 03/13/2021 11:26 AM EDT ----- Regarding: Lab orders for Wednesday, 9.21.22 Patient is scheduled for CPX labs, please order future labs, Thanks , Karna Christmas

## 2021-03-28 ENCOUNTER — Other Ambulatory Visit: Payer: PPO

## 2021-04-03 ENCOUNTER — Encounter: Payer: PPO | Admitting: Family Medicine

## 2021-04-04 ENCOUNTER — Encounter: Payer: PPO | Admitting: Family Medicine

## 2021-04-27 DIAGNOSIS — H04123 Dry eye syndrome of bilateral lacrimal glands: Secondary | ICD-10-CM | POA: Diagnosis not present

## 2021-05-01 ENCOUNTER — Other Ambulatory Visit: Payer: Self-pay

## 2021-05-01 ENCOUNTER — Encounter: Payer: Self-pay | Admitting: Family Medicine

## 2021-05-01 ENCOUNTER — Ambulatory Visit (INDEPENDENT_AMBULATORY_CARE_PROVIDER_SITE_OTHER): Payer: PPO | Admitting: Family Medicine

## 2021-05-01 VITALS — BP 126/72 | HR 66 | Temp 98.0°F | Resp 16 | Ht 61.0 in | Wt 123.4 lb

## 2021-05-01 DIAGNOSIS — Z1231 Encounter for screening mammogram for malignant neoplasm of breast: Secondary | ICD-10-CM | POA: Diagnosis not present

## 2021-05-01 DIAGNOSIS — I10 Essential (primary) hypertension: Secondary | ICD-10-CM

## 2021-05-01 DIAGNOSIS — M810 Age-related osteoporosis without current pathological fracture: Secondary | ICD-10-CM | POA: Diagnosis not present

## 2021-05-01 DIAGNOSIS — Z1211 Encounter for screening for malignant neoplasm of colon: Secondary | ICD-10-CM

## 2021-05-01 DIAGNOSIS — M25552 Pain in left hip: Secondary | ICD-10-CM | POA: Insufficient documentation

## 2021-05-01 DIAGNOSIS — Z0000 Encounter for general adult medical examination without abnormal findings: Secondary | ICD-10-CM

## 2021-05-01 DIAGNOSIS — Z85828 Personal history of other malignant neoplasm of skin: Secondary | ICD-10-CM

## 2021-05-01 LAB — CBC WITH DIFFERENTIAL/PLATELET
Basophils Absolute: 0 10*3/uL (ref 0.0–0.1)
Basophils Relative: 0.9 % (ref 0.0–3.0)
Eosinophils Absolute: 0.2 10*3/uL (ref 0.0–0.7)
Eosinophils Relative: 5.9 % — ABNORMAL HIGH (ref 0.0–5.0)
HCT: 39.9 % (ref 36.0–46.0)
Hemoglobin: 13.3 g/dL (ref 12.0–15.0)
Lymphocytes Relative: 33.5 % (ref 12.0–46.0)
Lymphs Abs: 1.4 10*3/uL (ref 0.7–4.0)
MCHC: 33.4 g/dL (ref 30.0–36.0)
MCV: 90.7 fl (ref 78.0–100.0)
Monocytes Absolute: 0.5 10*3/uL (ref 0.1–1.0)
Monocytes Relative: 12.6 % — ABNORMAL HIGH (ref 3.0–12.0)
Neutro Abs: 2 10*3/uL (ref 1.4–7.7)
Neutrophils Relative %: 47.1 % (ref 43.0–77.0)
Platelets: 198 10*3/uL (ref 150.0–400.0)
RBC: 4.39 Mil/uL (ref 3.87–5.11)
RDW: 13 % (ref 11.5–15.5)
WBC: 4.2 10*3/uL (ref 4.0–10.5)

## 2021-05-01 LAB — LIPID PANEL
Cholesterol: 183 mg/dL (ref 0–200)
HDL: 83.9 mg/dL (ref >39.00)
LDL Cholesterol: 87 mg/dL (ref 0–99)
NonHDL: 98.75
Total CHOL/HDL Ratio: 2
Triglycerides: 61 mg/dL (ref 0.0–149.0)
VLDL: 12.2 mg/dL (ref 0.0–40.0)

## 2021-05-01 LAB — COMPREHENSIVE METABOLIC PANEL
ALT: 15 U/L (ref 0–35)
AST: 21 U/L (ref 0–37)
Albumin: 4.3 g/dL (ref 3.5–5.2)
Alkaline Phosphatase: 49 U/L (ref 39–117)
BUN: 21 mg/dL (ref 6–23)
CO2: 32 mEq/L (ref 19–32)
Calcium: 10 mg/dL (ref 8.4–10.5)
Chloride: 100 mEq/L (ref 96–112)
Creatinine, Ser: 0.81 mg/dL (ref 0.40–1.20)
GFR: 71.02 mL/min (ref >60.00)
Glucose, Bld: 82 mg/dL (ref 70–99)
Potassium: 4.4 mEq/L (ref 3.5–5.1)
Sodium: 138 mEq/L (ref 135–145)
Total Bilirubin: 1.6 mg/dL — ABNORMAL HIGH (ref 0.2–1.2)
Total Protein: 6.8 g/dL (ref 6.0–8.3)

## 2021-05-01 LAB — TSH: TSH: 1.96 u[IU]/mL (ref 0.35–5.50)

## 2021-05-01 LAB — VITAMIN D 25 HYDROXY (VIT D DEFICIENCY, FRACTURES): VITD: 82.49 ng/mL (ref 30.00–100.00)

## 2021-05-01 MED ORDER — LISINOPRIL-HYDROCHLOROTHIAZIDE 10-12.5 MG PO TABS: 0.5000 | ORAL_TABLET | Freq: Every day | ORAL | 3 refills | Status: DC

## 2021-05-01 NOTE — Assessment & Plan Note (Signed)
Reviewed health habits including diet and exercise and skin cancer prevention Reviewed appropriate screening tests for age  Also reviewed health mt list, fam hx and immunization status , as well as social and family history   See HPI Labs ordered Colonoscopy is up to date Mammogram ordered  utd vaccines  utd dexa, no falls or fractures Abn hearing screen, pt aware and declines audiology visit, (no cerumen) Eye care utd this month PHQ score 0 No cognitive concerns , very sharp (encouraged socialization)  No problems with ADLs, high functioning Given materials to work on advance directive

## 2021-05-01 NOTE — Assessment & Plan Note (Signed)
Pain only with walking Recommended heat/ice voltaren gel  F/u if no imp

## 2021-05-01 NOTE — Assessment & Plan Note (Signed)
Colonoscopy nl 2014 utd

## 2021-05-01 NOTE — Assessment & Plan Note (Signed)
Last dexa 8/21  No falls or fractures Takes vit D Good exercise with walking

## 2021-05-01 NOTE — Assessment & Plan Note (Signed)
Scheduled annual screening mammogram Nl breast exam today  Encouraged monthly self exams   

## 2021-05-01 NOTE — Assessment & Plan Note (Signed)
utd derm care Nothing new Had cyst removed on scalp

## 2021-05-01 NOTE — Patient Instructions (Addendum)
Please work on the packed for advance directive and get it notarized   Use ice on your hip area for 10 minutes at a time  Get voltaren gel - apply topically up to four times daily   If not improving let us know   Socialization is helpful for brain function   Don't forget to schedule your mammogram  Please call the location of your choice from the menu below to schedule your Mammogram and/or Bone Density appointment.    [facility]                      Phone:  [phone], [place] [address]                                                             Services: Traditional and 3D Mammogram, [facility]                 Phone: [phone], [place] [address]    Service: Bone Density ONLY   *this site does NOT perform mammograms  [facility]                        Phone:  [phone] [address], [place] [address]                                            Services:  3D Mammogram and [place] at [facility]   Phone:  [phone]   [address] [place], [place] [address]                                            Services: 3D Mammogram and Bone Density  [place] at [facility])  Phone:  [phone]   [address] Room [place], [place] [address]                                              Services:  3D Mammogram and Bone Density

## 2021-05-01 NOTE — Assessment & Plan Note (Signed)
bp in fair control at this time  BP Readings from Last 1 Encounters:  05/01/21 126/72   No changes needed Most recent labs reviewed  Disc lifstyle change with low sodium diet and exercise  Plan to continue lisinopril hct 10-12.5 mg daily  Labs ordered

## 2021-05-01 NOTE — Progress Notes (Signed)
Subjective:    Patient ID: Marie Kennedy, female    DOB: February 27, 1946, 75 y.o.   MRN: [id number]  This visit occurred during the SARS-CoV-2 public health emergency.  Safety protocols were in place, including screening questions prior to the visit, additional usage of staff PPE, and extensive cleaning of exam room while observing appropriate contact time as indicated for disinfecting solutions.   HPI Pt presents for amw and health mt exam   I have personally reviewed the Medicare Annual Wellness questionnaire and have noted 1. The patient's medical and social history 2. Their use of alcohol, tobacco or illicit drugs 3. Their current medications and supplements 4. The patient's functional ability including ADL's, fall risks, home safety risks and hearing or visual             impairment. 5. Diet and physical activities 6. Evidence for depression or mood disorders  The patients weight, height, BMI have been recorded in the chart and visual acuity is per eye clinic.  I have made referrals, counseling and provided education to the patient based review of the above and I have provided the pt with a written personalized care plan for preventive services. Reviewed and updated provider list, see scanned forms.  See scanned forms.  Routine anticipatory guidance given to patient.  See health maintenance. Colon cancer screening -nl colonoscopy 4/21  Breast cancer screening  mammogram 8/21 -at [facility] breast exam-no lumps  Mother had breast cancer at 48 Flu vaccine- this month Covid immunized  Tetanus 12/13 Td  Pneumovax completed Zoster vaccine-had shingrix Dexa 8/21 stable OP Falls none Fractures-none  Supplements- taking vitamin D Exercise -walks every am about 3 miles   Not doing a lot  Feeling ok   Some hip pain with walking  (outside of hip and moves down her leg to her knee)  Sitting or standing is ok  Has not tried medicine or ice or heat   Advance directive-given materials  to work on   Cognitive function addressed- see scanned forms- and if abnormal then additional documentation follows.   No problems at all  Handles own affairs and finances  Does not misplace things   Does not socialize much   PMH and SH reviewed  Meds, vitals, and allergies reviewed.   ROS: See HPI.  Otherwise negative.    Weight : Wt Readings from Last 3 Encounters:  05/01/21 123 lb 6.4 oz (56 kg)  01/28/20 122 lb 4 oz (55.5 kg)  11/04/19 125 lb (56.7 kg)   23.32 kg/m   Hearing/vision: No results found.  Cannot hear well at all  Declines audiology eval or hearing aides  Eye care is up to date from [facility] Dr Wallace Going - this month   PHQ: Depression screen [facility] 2/9 05/01/2021 12/30/2018 12/10/2017 12/04/2016 11/29/2015  Decreased Interest 0 0 0 0 0  Down, Depressed, Hopeless 0 0 0 0 0  PHQ - 2 Score 0 0 0 0 0  Altered sleeping - 0 0 - -  Tired, decreased energy - 0 0 - -  Change in appetite - 0 0 - -  Feeling bad or failure about yourself  - 0 0 - -  Trouble concentrating - 0 0 - -  Moving slowly or fidgety/restless - 0 0 - -  Suicidal thoughts - 0 0 - -  PHQ-9 Score - 0 0 - -  Difficult doing work/chores - Not difficult at all Not difficult at all - -  ADLs: no problems   Functionality:very good   Care team   HTN bp is stable today  No cp or palpitations or headaches or edema  No side effects to medicines  BP Readings from Last 3 Encounters:  05/01/21 126/72  01/28/20 (!) 109/60  11/04/19 (!) 146/70     Eats a very healthy diet  Avoids junk food  Probably needs more fruit/veg   Controlled with lisinopril hct 10-12.5 mg daily   Patient Active Problem List   Diagnosis Date Noted   Medicare annual wellness visit, subsequent 01/28/2020   Routine general medical examination at a health care facility 11/22/2015   Estrogen deficiency 11/22/2015   Exposure to communicable disease 11/22/2015   Personal history of other malignant neoplasm of  skin 06/01/2013   Colon cancer screening 06/22/2012   Hyperbilirubinemia 06/22/2012   Osteoporosis 12/12/2010   Screening mammogram, encounter for 12/04/2010   ALLERGIC RHINITIS, SEASONAL 11/13/2009   Essential hypertension 05/04/2007   IBS 05/04/2007   ROSACEA 05/04/2007   Past Medical History:  Diagnosis Date   Allergy    Basal cell adenoma 11/14   face   Basal cell carcinoma 03/11/2013   Right forehead. Nodular pattern. Excised: 05/14/2016   Cancer (HCC)    skin   Fibroids 1993   cysts endometriosis   GERD (gastroesophageal reflux disease)    History of basal cell carcinoma (BCC) 05/14/2016   right superior forehead, excision   Hx of basal cell carcinoma 03/11/2013   Right ala of nose. BCC with sclerosis   Hx of basal cell carcinoma 01/05/2015   Left lateral proximal elbow. Superficial.   Hypertension    PCP   Dr Glori Bickers at [facility]   IBS (irritable bowel syndrome)    Rosacea    Past Surgical History:  Procedure Laterality Date   ABDOMINAL HYSTERECTOMY  1993   BASAL CELL CARCINOMA EXCISION  11/14   face   BREAST EXCISIONAL BIOPSY Right 1970   Negative   BREAST SURGERY Right 1970's   lumpectomy no cancer   CATARACT EXTRACTION W/PHACO Left 05/19/2017   Procedure: CATARACT EXTRACTION PHACO AND INTRAOCULAR LENS PLACEMENT ([place]) LEFT;  Surgeon: Leandrew Koyanagi, MD;  Location: [facility];  Service: Ophthalmology;  Laterality: Left;   CATARACT EXTRACTION W/PHACO Right 06/18/2017   Procedure: CATARACT EXTRACTION PHACO AND INTRAOCULAR LENS PLACEMENT ([place])  COMPLICATED  RIGHT;  Surgeon: Leandrew Koyanagi, MD;  Location: [facility];  Service: Ophthalmology;  Laterality: Right;   CHOLECYSTECTOMY  06/18/2011   Procedure: LAPAROSCOPIC CHOLECYSTECTOMY WITH INTRAOPERATIVE CHOLANGIOGRAM;  Surgeon: Joyice Faster. Cornett, MD;  Location: [facility];  Service: General;  Laterality: N/A;  lap chole with IOC   OOPHORECTOMY     TONSILLECTOMY  1953   Social  History   Tobacco Use   Smoking status: Never   Smokeless tobacco: Never  Vaping Use   Vaping Use: Never used  Substance Use Topics   Alcohol use: No    Alcohol/week: 0.0 standard drinks   Drug use: No   Family History  Problem Relation Age of Onset   Hypertension Mother    Cancer Mother        breast, uterine, and cervical   Breast cancer Mother 65   Heart disease Father        CAD   Hypertension Father    Diabetes Father        TYPE II   Cancer Father        brain   Colon cancer  Neg Hx    Esophageal cancer Neg Hx    Rectal cancer Neg Hx    Stomach cancer Neg Hx    No Known Allergies Current Outpatient Medications on File Prior to Visit  Medication Sig Dispense Refill   Calcium Carbonate-Vit D-Min 600-400 MG-UNIT TABS Take 1 tablet by mouth daily.      Cholecalciferol (VITAMIN D3 PO) Take 4,000 Units by mouth daily.     clotrimazole-betamethasone (LOTRISONE) cream APPLY TWICE A DAY TO AFFECTED FINGERS AS NEEDED 15 g 2   DiphenhydrAMINE HCl (ALLERGY MED PO) Take 25 mg by mouth daily as needed. Allergies      hyoscyamine (LEVSIN SL) 0.125 MG SL tablet Place 1 tablet (0.125 mg total) under the tongue 2 (two) times daily as needed for cramping. Abdominal cramping 30 tablet 11   metroNIDAZOLE (METROCREAM) 0.75 % cream Apply 1 application topically daily. Rosacea 135 g 2   mupirocin ointment (BACTROBAN) 2 % Apply 1 application topically daily. Qd to excision site 22 g 1   No current facility-administered medications on file prior to visit.    Review of Systems  Constitutional:  Negative for activity change, appetite change, fatigue, fever and unexpected weight change.  HENT:  Negative for congestion, ear pain, rhinorrhea, sinus pressure and sore throat.   Eyes:  Negative for pain, redness and visual disturbance.  Respiratory:  Negative for cough, shortness of breath and wheezing.   Cardiovascular:  Negative for chest pain and palpitations.  Gastrointestinal:  Negative for  abdominal pain, blood in stool, constipation and diarrhea.  Endocrine: Negative for polydipsia and polyuria.  Genitourinary:  Negative for dysuria, frequency and urgency.  Musculoskeletal:  Positive for arthralgias. Negative for back pain and myalgias.       Pain in external L hip when walking No groin pain   Skin:  Negative for pallor and rash.  Allergic/Immunologic: Negative for environmental allergies.  Neurological:  Negative for dizziness, syncope and headaches.  Hematological:  Negative for adenopathy. Does not bruise/bleed easily.  Psychiatric/Behavioral:  Negative for decreased concentration and dysphoric mood. The patient is not nervous/anxious.       Objective:   Physical Exam Constitutional:      General: She is not in acute distress.    Appearance: Normal appearance. She is well-developed and normal weight. She is not ill-appearing or diaphoretic.  HENT:     Head: Normocephalic and atraumatic.     Right Ear: Tympanic membrane, ear canal and external ear normal.     Left Ear: Tympanic membrane, ear canal and external ear normal.     Nose: Nose normal. No congestion.     Mouth/Throat:     Mouth: Mucous membranes are moist.     Pharynx: Oropharynx is clear. No posterior oropharyngeal erythema.  Eyes:     General: No scleral icterus.    Extraocular Movements: Extraocular movements intact.     Conjunctiva/sclera: Conjunctivae normal.     Pupils: Pupils are equal, round, and reactive to light.  Neck:     Thyroid: No thyromegaly.     Vascular: No carotid bruit or JVD.  Cardiovascular:     Rate and Rhythm: Normal rate and regular rhythm.     Pulses: Normal pulses.     Heart sounds: Normal heart sounds.    No gallop.  Pulmonary:     Effort: Pulmonary effort is normal. No respiratory distress.     Breath sounds: Normal breath sounds. No wheezing.     Comments: Good air exch  Chest:     Chest wall: No tenderness.  Abdominal:     General: Bowel sounds are normal. There is  no distension or abdominal bruit.     Palpations: Abdomen is soft. There is no mass.     Tenderness: There is no abdominal tenderness.     Hernia: No hernia is present.  Genitourinary:    Comments: Breast exam: No mass, nodules, thickening, tenderness, bulging, retraction, inflamation, nipple discharge or skin changes noted.  No axillary or clavicular LA.     Musculoskeletal:        General: No tenderness. Normal range of motion.     Cervical back: Normal range of motion and neck supple. No rigidity. No muscular tenderness.     Right lower leg: No edema.     Left lower leg: No edema.     Comments: No kyphosis   Some tenderness L greater trochanter  Lymphadenopathy:     Cervical: No cervical adenopathy.  Skin:    General: Skin is warm and dry.     Coloration: Skin is not pale.     Findings: No erythema or rash.     Comments: Fair complexion  Few lentigines  Neurological:     Mental Status: She is alert. Mental status is at baseline.     Cranial Nerves: No cranial nerve deficit.     Motor: No abnormal muscle tone.     Coordination: Coordination normal.     Gait: Gait normal.     Deep Tendon Reflexes: Reflexes are normal and symmetric. Reflexes normal.  Psychiatric:        Mood and Affect: Mood normal.        Cognition and Memory: Cognition and memory normal.          Assessment & Plan:   Problem List Items Addressed This Visit       Cardiovascular and Mediastinum   Essential hypertension    bp in fair control at this time  BP Readings from Last 1 Encounters:  05/01/21 126/72  No changes needed Most recent labs reviewed  Disc lifstyle change with low sodium diet and exercise  Plan to continue lisinopril hct 10-12.5 mg daily  Labs ordered       Relevant Medications   lisinopril-hydrochlorothiazide (ZESTORETIC) 10-12.5 MG tablet     Musculoskeletal and Integument   Osteoporosis    Last dexa 8/21  No falls or fractures Takes vit D Good exercise with walking          Other   Screening mammogram, encounter for    Scheduled annual screening mammogram Nl breast exam today  Encouraged monthly self exams        Relevant Orders   MM 3D SCREEN BREAST BILATERAL   Colon cancer screening    Colonoscopy nl 2014 utd      Routine general medical examination at a health care facility    Reviewed health habits including diet and exercise and skin cancer prevention Reviewed appropriate screening tests for age  Also reviewed health mt list, fam hx and immunization status , as well as social and family history   See HPI Labs ordered Colonoscopy is up to date Mammogram ordered  utd vaccines  utd dexa, no falls or fractures Abn hearing screen, pt aware and declines audiology visit, (no cerumen) Eye care utd this month PHQ score 0 No cognitive concerns , very sharp (encouraged socialization)  No problems with ADLs, high functioning Given materials to work on advance directive  Personal history of other malignant neoplasm of skin    utd derm care Nothing new Had cyst removed on scalp      Medicare annual wellness visit, subsequent - Primary    Reviewed health habits including diet and exercise and skin cancer prevention Reviewed appropriate screening tests for age  Also reviewed health mt list, fam hx and immunization status , as well as social and family history   See HPI Labs ordered Colonoscopy is up to date Mammogram ordered  utd vaccines  utd dexa, no falls or fractures Abn hearing screen, pt aware and declines audiology visit, (no cerumen) Eye care utd this month PHQ score 0 No cognitive concerns , very sharp (encouraged socialization)  No problems with ADLs, high functioning Given materials to work on advance directive

## 2021-05-07 ENCOUNTER — Other Ambulatory Visit: Payer: Self-pay | Admitting: Family Medicine

## 2021-05-07 NOTE — Telephone Encounter (Signed)
  Encourage patient to contact the pharmacy for refills or they can request refills through [facility]:  Please schedule appointment if longer than 1 year  NEXT APPOINTMENT DATE:no future appt  MEDICATION:yes  Is the patient out of medication? hyoscyamine (LEVSIN SL) 0.125 MG SL tablet  PHARMACY:[id number] - [place], [place] - [address]  Let patient know to contact pharmacy at the end of the day to make sure medication is ready.  Please notify patient to allow 48-72 hours to process  CLINICAL FILLS OUT ALL BELOW:   LAST REFILL:  QTY:  REFILL DATE:    OTHER COMMENTS:    Okay for refill?  Please advise

## 2021-05-08 MED ORDER — HYOSCYAMINE SULFATE 0.125 MG SL SUBL: 0.1250 mg | SUBLINGUAL_TABLET | Freq: Two times a day (BID) | SUBLINGUAL | 0 refills | Status: DC | PRN

## 2021-05-08 NOTE — Addendum Note (Signed)
Addended by: Tammi Sou on: 05/08/2021 08:53 AM   Modules accepted: Orders

## 2021-05-08 NOTE — Addendum Note (Signed)
Addended by: Loura Pardon A on: 05/08/2021 01:03 PM   Modules accepted: Orders

## 2021-05-08 NOTE — Telephone Encounter (Addendum)
CPE was 05/01/21, last filled on 12/17/17 #30 tabs with 11 refill  [facility]

## 2021-05-10 ENCOUNTER — Other Ambulatory Visit: Payer: Self-pay

## 2021-05-10 ENCOUNTER — Ambulatory Visit
Admission: RE | Admit: 2021-05-10 | Discharge: 2021-05-10 | Disposition: A | Discharge status: Home / Self Care | Payer: PPO | Source: Ambulatory Visit | Attending: Family Medicine | Admitting: Family Medicine

## 2021-05-10 DIAGNOSIS — Z1231 Encounter for screening mammogram for malignant neoplasm of breast: Secondary | ICD-10-CM | POA: Diagnosis not present

## 2021-05-23 ENCOUNTER — Other Ambulatory Visit: Payer: Self-pay | Admitting: Family Medicine

## 2021-05-23 NOTE — Telephone Encounter (Signed)
CPE was on 05/01/21, last filled on 05/08/21 #30 tabs with directions of BID prn, please advise

## 2021-06-06 ENCOUNTER — Other Ambulatory Visit: Payer: Self-pay | Admitting: Family Medicine

## 2021-07-17 ENCOUNTER — Other Ambulatory Visit: Payer: Self-pay | Admitting: Dermatology

## 2021-07-17 DIAGNOSIS — L719 Rosacea, unspecified: Secondary | ICD-10-CM

## 2022-01-31 ENCOUNTER — Ambulatory Visit: Payer: PPO | Admitting: Dermatology

## 2022-01-31 DIAGNOSIS — R21 Rash and other nonspecific skin eruption: Secondary | ICD-10-CM

## 2022-01-31 DIAGNOSIS — Z1283 Encounter for screening for malignant neoplasm of skin: Secondary | ICD-10-CM

## 2022-01-31 DIAGNOSIS — L719 Rosacea, unspecified: Secondary | ICD-10-CM | POA: Diagnosis not present

## 2022-01-31 DIAGNOSIS — D229 Melanocytic nevi, unspecified: Secondary | ICD-10-CM

## 2022-01-31 DIAGNOSIS — D1800 Hemangioma unspecified site: Secondary | ICD-10-CM

## 2022-01-31 DIAGNOSIS — L578 Other skin changes due to chronic exposure to nonionizing radiation: Secondary | ICD-10-CM

## 2022-01-31 DIAGNOSIS — L821 Other seborrheic keratosis: Secondary | ICD-10-CM

## 2022-01-31 DIAGNOSIS — Z85828 Personal history of other malignant neoplasm of skin: Secondary | ICD-10-CM

## 2022-01-31 DIAGNOSIS — L814 Other melanin hyperpigmentation: Secondary | ICD-10-CM

## 2022-01-31 MED ORDER — IVERMECTIN 1 % EX CREA: 1.0000 | TOPICAL_CREAM | Freq: Every day | CUTANEOUS | 11 refills | Status: DC

## 2022-01-31 NOTE — Progress Notes (Signed)
Follow-Up Visit   Subjective  Marie Kennedy is a 76 y.o. female who presents for the following: Total body skin exam (Hx of BCCS), check spots (Face, L post ear, ~ 41m one on L post ear irritated by glasses), and Rosacea (Face, Metronidazole 0.75% lotion and cream bid). The patient presents for Total-Body Skin Exam (TBSE) for skin cancer screening and mole check.  The patient has spots, moles and lesions to be evaluated, some may be new or changing and the patient has concerns that these could be cancer.   The following portions of the chart were reviewed this encounter and updated as appropriate:   Tobacco  Allergies  Meds  Problems  Med Hx  Surg Hx  Fam Hx     Review of Systems:  No other skin or systemic complaints except as noted in HPI or Assessment and Plan.  Objective  Well appearing patient in no apparent distress; mood and affect are within normal limits.  A full examination was performed including scalp, head, eyes, ears, nose, lips, neck, chest, axillae, abdomen, back, buttocks, bilateral upper extremities, bilateral lower extremities, hands, feet, fingers, toes, fingernails, and toenails. All findings within normal limits unless otherwise noted below.  face Erythema face   Assessment & Plan   History of Basal Cell Carcinoma of the Skin - No evidence of recurrence today - Recommend regular full body skin exams - Recommend daily broad spectrum sunscreen SPF 30+ to sun-exposed areas, reapply every 2 hours as needed.  - Call if any new or changing lesions are noted between office visits  - R forehead, R sup forehead, R ala of nose, L lat proximal elbow  Lentigines - Scattered tan macules - Due to sun exposure - Benign-appearing, observe - Recommend daily broad spectrum sunscreen SPF 30+ to sun-exposed areas, reapply every 2 hours as needed. - Call for any changes  Seborrheic Keratoses - Stuck-on, waxy, tan-brown papules and/or plaques  - Benign-appearing -  Discussed benign etiology and prognosis. - Observe - Call for any changes  Melanocytic Nevi - Tan-brown and/or pink-flesh-colored symmetric macules and papules - Benign appearing on exam today - Observation - Call clinic for new or changing moles - Recommend daily use of broad spectrum spf 30+ sunscreen to sun-exposed areas.   Hemangiomas - Red papules - Discussed benign nature - Observe - Call for any changes  Actinic Damage - Chronic condition, secondary to cumulative UV/sun exposure - diffuse scaly erythematous macules with underlying dyspigmentation - Recommend daily broad spectrum sunscreen SPF 30+ to sun-exposed areas, reapply every 2 hours as needed.  - Staying in the shade or wearing long sleeves, sun glasses (UVA+UVB protection) and wide brim hats (4-inch brim around the entire circumference of the hat) are also recommended for sun protection.  - Call for new or changing lesions.  Skin cancer screening performed today.  Pressure Erythema - L post ear, 2ndary from glasses, recommend pt have glasses adjusted.  Rosacea face Rosacea is a chronic progressive skin condition usually affecting the face of adults, causing redness and/or acne bumps. It is treatable but not curable. It sometimes affects the eyes (ocular rosacea) as well. It may respond to topical and/or systemic medication and can flare with stress, sun exposure, alcohol, exercise and some foods.  Daily application of broad spectrum spf 30+ sunscreen to face is recommended to reduce flares.  D/c Metronidazole 0.75% Start SM Triple cream (Azelaic Acid/Metronidazole/Ivermectin) qhs to face ([facility]) Apply 1 Application topically  at bedtime.  Return in about 1 year (around 02/01/2023) for TBSE, Hx of BCC.  I, Othelia Pulling, RMA, am acting as scribe for Sarina Ser, MD . Documentation: I have reviewed the above documentation for accuracy and completeness, and I agree with the  above.  Sarina Ser, MD

## 2022-01-31 NOTE — Patient Instructions (Addendum)
Instructions for [facility] Medications  One or more of your medications was sent to the [facility] mail order compounding pharmacy. You will receive an email from them and can purchase the medicine through that link. It will then be mailed to your home at the address you confirmed. If for any reason you do not receive an email from them, please check your spam folder. If you still do not find the email, please let us know. [facility] phone number is [phone].       Due to recent changes in healthcare laws, you may see results of your pathology and/or laboratory studies on MyChart before the doctors have had a chance to review them. We understand that in some cases there may be results that are confusing or concerning to you. Please understand that not all results are received at the same time and often the doctors may need to interpret multiple results in order to provide you with the best plan of care or course of treatment. Therefore, we ask that you please give us 2 business days to thoroughly review all your results before contacting the office for clarification. Should we see a critical lab result, you will be contacted sooner.   If You Need Anything After Your Visit  If you have any questions or concerns for your doctor, please call our main line at [phone] and press option 4 to reach your doctor's medical assistant. If no one answers, please leave a voicemail as directed and we will return your call as soon as possible. Messages left after 4 pm will be answered the following business day.   You may also send us a message via MyChart. We typically respond to MyChart messages within 1-2 business days.  For prescription refills, please ask your pharmacy to contact our office. Our fax number is [phone].  If you have an urgent issue when the clinic is closed that cannot wait until the next business day, you can page your doctor at the number below.    Please note  that while we do our best to be available for urgent issues outside of office hours, we are not available 24/7.   If you have an urgent issue and are unable to reach us, you may choose to seek medical care at your doctor's office, retail clinic, urgent care center, or emergency room.  If you have a medical emergency, please immediately call 911 or go to the emergency department.  Pager Numbers  - Dr. Kowalski: [phone]  - Dr. Moye: [phone]  - Dr. Stewart: [phone]  In the event of inclement weather, please call our main line at [phone] for an update on the status of any delays or closures.  Dermatology Medication Tips: Please keep the boxes that topical medications come in in order to help keep track of the instructions about where and how to use these. Pharmacies typically print the medication instructions only on the boxes and not directly on the medication tubes.   If your medication is too expensive, please contact our office at [phone] option 4 or send us a message through MyChart.   We are unable to tell what your co-pay for medications will be in advance as this is different depending on your insurance coverage. However, we may be able to find a substitute medication at lower cost or fill out paperwork to get insurance to cover a needed medication.   If a prior authorization is required to get your medication covered by your insurance   company, please allow us 1-2 business days to complete this process.  Drug prices often vary depending on where the prescription is filled and some pharmacies may offer cheaper prices.  The website www.goodrx.com contains coupons for medications through different pharmacies. The prices here do not account for what the cost may be with help from insurance (it may be cheaper with your insurance), but the website can give you the price if you did not use any insurance.  - You can print the associated coupon and take it with your  prescription to the pharmacy.  - You may also stop by our office during regular business hours and pick up a GoodRx coupon card.  - If you need your prescription sent electronically to a different pharmacy, notify our office through [facility] MyChart or by phone at [phone] option 4.     Si Usted Necesita Algo Despus de Su Visita  Tambin puede enviarnos un mensaje a travs de MyChart. Por lo general respondemos a los mensajes de MyChart en el transcurso de 1 a 2 das hbiles.  Para renovar recetas, por favor pida a su farmacia que se ponga en contacto con nuestra oficina. Nuestro nmero de fax es el [phone].  Si tiene un asunto urgente cuando la clnica est cerrada y que no puede esperar hasta el siguiente da hbil, puede llamar/localizar a su doctor(a) al nmero que aparece a continuacin.   Por favor, tenga en cuenta que aunque hacemos todo lo posible para estar disponibles para asuntos urgentes fuera del horario de oficina, no estamos disponibles las 24 horas del da, los 7 das de la semana.   Si tiene un problema urgente y no puede comunicarse con nosotros, puede optar por buscar atencin mdica  en el consultorio de su doctor(a), en una clnica privada, en un centro de atencin urgente o en una sala de emergencias.  Si tiene una emergencia mdica, por favor llame inmediatamente al 911 o vaya a la sala de emergencias.  Nmeros de bper  - Dr. Kowalski: [phone]  - Dra. Moye: [phone]  - Dra. Stewart: [phone]  En caso de inclemencias del tiempo, por favor llame a nuestra lnea principal al [phone] para una actualizacin sobre el estado de cualquier retraso o cierre.  Consejos para la medicacin en dermatologa: Por favor, guarde las cajas en las que vienen los medicamentos de uso tpico para ayudarle a seguir las instrucciones sobre dnde y cmo usarlos. Las farmacias generalmente imprimen las instrucciones del medicamento slo en las cajas y no  directamente en los tubos del medicamento.   Si su medicamento es muy caro, por favor, pngase en contacto con nuestra oficina llamando al [phone] y presione la opcin 4 o envenos un mensaje a travs de MyChart.   No podemos decirle cul ser su copago por los medicamentos por adelantado ya que esto es diferente dependiendo de la cobertura de su seguro. Sin embargo, es posible que podamos encontrar un medicamento sustituto a menor costo o llenar un formulario para que el seguro cubra el medicamento que se considera necesario.   Si se requiere una autorizacin previa para que su compaa de seguros cubra su medicamento, por favor permtanos de 1 a 2 das hbiles para completar este proceso.  Los precios de los medicamentos varan con frecuencia dependiendo del lugar de dnde se surte la receta y alguna farmacias pueden ofrecer precios ms baratos.  El sitio web www.goodrx.com tiene cupones para medicamentos de diferentes farmacias. Los precios aqu no tienen en cuenta   lo que podra costar con la ayuda del seguro (puede ser ms barato con su seguro), pero el sitio web puede darle el precio si no utiliz ningn seguro.  - Puede imprimir el cupn correspondiente y llevarlo con su receta a la farmacia.  - Tambin puede pasar por nuestra oficina durante el horario de atencin regular y recoger una tarjeta de cupones de GoodRx.  - Si necesita que su receta se enve electrnicamente a una farmacia diferente, informe a nuestra oficina a travs de MyChart de [facility] o por telfono llamando al [phone] y presione la opcin 4.  

## 2022-02-03 ENCOUNTER — Encounter: Payer: Self-pay | Admitting: Dermatology

## 2022-04-10 ENCOUNTER — Ambulatory Visit: Payer: PPO

## 2022-04-16 ENCOUNTER — Ambulatory Visit (INDEPENDENT_AMBULATORY_CARE_PROVIDER_SITE_OTHER): Payer: PPO

## 2022-04-16 DIAGNOSIS — Z23 Encounter for immunization: Secondary | ICD-10-CM

## 2022-05-07 ENCOUNTER — Ambulatory Visit (INDEPENDENT_AMBULATORY_CARE_PROVIDER_SITE_OTHER): Payer: PPO | Admitting: *Deleted

## 2022-05-07 DIAGNOSIS — Z0000 Encounter for general adult medical examination without abnormal findings: Secondary | ICD-10-CM

## 2022-05-07 NOTE — Progress Notes (Signed)
Subjective:   Marie Kennedy is a 76 y.o. female who presents for Medicare Annual (Subsequent) preventive examination.  I connected with  Marie Kennedy on 05/07/22 by a telephone enabled telemedicine application and verified that I am speaking with the correct person using two identifiers.   I discussed the limitations of evaluation and management by telemedicine. The patient expressed understanding and agreed to proceed.  Patient location: home  Provider location: Tele-health-home    Review of Systems     Cardiac Risk Factors include: advanced age (>58mn, >>74women);hypertension     Objective:    Today's Vitals   There is no height or weight on file to calculate BMI.     05/07/2022   12:03 PM 12/30/2018    8:32 AM 12/10/2017    9:00 AM 06/18/2017    8:44 AM 05/19/2017    9:13 AM 12/04/2016    8:47 AM 11/29/2015    2:25 PM  Advanced Directives  Does Patient Have a Medical Advance Directive? No No No No No No No  Would patient like information on creating a medical advance directive? No - Patient declined No - Patient declined No - Patient declined No - Patient declined No - Patient declined No - Patient declined Yes - Educational materials given    Current Medications (verified) Outpatient Encounter Medications as of 05/07/2022  Medication Sig   Calcium Carbonate-Vit D-Min 600-400 MG-UNIT TABS Take 1 tablet by mouth daily.    Cholecalciferol (VITAMIN D3 PO) Take 4,000 Units by mouth daily.   clotrimazole-betamethasone (LOTRISONE) cream APPLY TWICE A DAY TO AFFECTED FINGERS AS NEEDED   DiphenhydrAMINE HCl (ALLERGY MED PO) Take 25 mg by mouth daily as needed. Allergies    hyoscyamine (LEVSIN SL) 0.125 MG SL tablet PLACE 1 TABLET UNDER THE TONGUE 2 TIMES DAILY AS NEEDED FOR ABDOMINAL CRAMPING   Ivermectin 1 % CREA Apply 1 Application topically at bedtime.   lisinopril-hydrochlorothiazide (ZESTORETIC) 10-12.5 MG tablet Take 0.5 tablets by mouth daily.   mupirocin  ointment (BACTROBAN) 2 % Apply 1 application topically daily. Qd to excision site   No facility-administered encounter medications on file as of 05/07/2022.    Allergies (verified) Patient has no known allergies.   History: Past Medical History:  Diagnosis Date   Allergy    Basal cell adenoma 11/14   face   Basal cell carcinoma 03/11/2013   Right forehead. Nodular pattern. Excised: 05/14/2016   Cancer (HCC)    skin   Fibroids 1993   cysts endometriosis   GERD (gastroesophageal reflux disease)    History of basal cell carcinoma (BCC) 05/14/2016   right superior forehead, excision   Hx of basal cell carcinoma 03/11/2013   Right ala of nose. BCC with sclerosis   Hx of basal cell carcinoma 01/05/2015   Left lateral proximal elbow. Superficial.   Hypertension    PC[facility]   [facility]  IBS (irritable bowel syndrome)    Rosacea    Past Surgical History:  Procedure Laterality Date   ABDOMINAL HYSTERECTOMY  1993   BASAL CELL CARCINOMA EXCISION  11/14   face   BREAST EXCISIONAL BIOPSY Right 1970   Negative   BREAST SURGERY Right 1970's   lumpectomy no cancer   CATARACT EXTRACTION W/PHACO Left 05/19/2017   Procedure: CATARACT EXTRACTION PHACO AND INTRAOCULAR LENS PLACEMENT ([facility] LEFT;  Surgeon: BLeandrew Koyanagi MD;  [facility]  Service: Ophthalmology;  Laterality: Left;   CATARACT EXTRACTION W/PHACO  Right 06/18/2017   Procedure: CATARACT EXTRACTION PHACO AND INTRAOCULAR LENS PLACEMENT ([place])  COMPLICATED  RIGHT;  Surgeon: Leandrew Koyanagi, MD;  Location: [facility];  Service: Ophthalmology;  Laterality: Right;   CHOLECYSTECTOMY  06/18/2011   Procedure: LAPAROSCOPIC CHOLECYSTECTOMY WITH INTRAOPERATIVE CHOLANGIOGRAM;  Surgeon: Joyice Faster. Cornett, MD;  Location: [facility];  Service: General;  Laterality: N/A;  lap chole with IOC   OOPHORECTOMY     TONSILLECTOMY  1953   Family History  Problem Relation Age of Onset   Hypertension  Mother    Cancer Mother        breast, uterine, and cervical   Breast cancer Mother 77   Heart disease Father        CAD   Hypertension Father    Diabetes Father        TYPE II   Cancer Father        brain   Colon cancer Neg Hx    Esophageal cancer Neg Hx    Rectal cancer Neg Hx    Stomach cancer Neg Hx    Social History   Socioeconomic History   Marital status: Married    Spouse name: Not on file   Number of children: Not on file   Years of education: Not on file   Highest education level: Not on file  Occupational History   Not on file  Tobacco Use   Smoking status: Never   Smokeless tobacco: Never  Vaping Use   Vaping Use: Never used  Substance and Sexual Activity   Alcohol use: No    Alcohol/week: 0.0 standard drinks of alcohol   Drug use: No   Sexual activity: Yes  Other Topics Concern   Not on file  Social History Narrative   Not on file   Social Determinants of Health   Financial Resource Strain: Low Risk  (05/07/2022)   Overall Financial Resource Strain (CARDIA)    Difficulty of Paying Living Expenses: Not hard at all  Food Insecurity: No Food Insecurity (05/07/2022)   Hunger Vital Sign    Worried About Running Out of Food in the Last Year: Never true    Ran Out of Food in the Last Year: Never true  Transportation Needs: No Transportation Needs (05/07/2022)   PRAPARE - Hydrologist (Medical): No    Lack of Transportation (Non-Medical): No  Physical Activity: Sufficiently Active (05/07/2022)   Exercise Vital Sign    Days of Exercise per Week: 4 days    Minutes of Exercise per Session: 40 min  Stress: No Stress Concern Present (05/07/2022)   [place]    Feeling of Stress : Not at all  Social Connections: Socially Isolated (05/07/2022)   Social Connection and Isolation Panel [NHANES]    Frequency of Communication with Friends and Family: Once a week     Frequency of Social Gatherings with Friends and Family: Never    Attends Religious Services: Never    Marine scientist or Organizations: No    Attends Music therapist: Never    Marital Status: Married    Tobacco Counseling Counseling given: Not Answered   Clinical Intake:  Pre-visit preparation completed: Yes  Pain : No/denies pain     Diabetes: No  How often do you need to have someone help you when you read instructions, pamphlets, or other written materials from your doctor or pharmacy?: 1 - Never  Diabetic?  no  Interpreter Needed?: No  Information entered by :: Leroy Kennedy LPN   Activities of Daily Living    05/07/2022   12:09 PM  In your present state of health, do you have any difficulty performing the following activities:  Hearing? 1  Vision? 0  Difficulty concentrating or making decisions? 0  Walking or climbing stairs? 0  Dressing or bathing? 0  Doing errands, shopping? 0  Preparing Food and eating ? N  Using the Toilet? N  In the past six months, have you accidently leaked urine? N  Do you have problems with loss of bowel control? N  Managing your Medications? N  Managing your Finances? N  Housekeeping or managing your Housekeeping? N    Patient Care Team: Tower, Wynelle Fanny, MD as PCP - General Ralene Bathe, MD as Referring Physician (Dermatology) Karren Burly Deirdre Peer, MD as Referring Physician (Ophthalmology) Karsten Fells, DDS as Referring Physician (Dentistry)  Indicate any recent Medical Services you may have received from other than [facility] providers in the past year (date may be approximate).     Assessment:   This is a routine wellness examination for Bonni.  Hearing/Vision screen Hearing Screening - Comments:: Some trouble hearing Does not use hearing aids Vision Screening - Comments:: [facility] Up to date  Dietary issues and exercise activities discussed: Current Exercise Habits: Home exercise routine,  Type of exercise: walking, Time (Minutes): 45, Frequency (Times/Week): 4, Weekly Exercise (Minutes/Week): 180, Intensity: Mild   Goals Addressed             This Visit's Progress    Patient Stated       Maintain walking schedule       Depression Screen    05/07/2022   12:10 PM 05/01/2021   11:02 AM 12/30/2018    8:32 AM 12/10/2017    8:44 AM 12/04/2016    8:35 AM 11/29/2015    2:24 PM 06/22/2012    2:54 PM  PHQ 2/9 Scores  PHQ - 2 Score 0 0 0 0 0 0 0  PHQ- 9 Score 0  0 0       Fall Risk    05/07/2022   12:04 PM 05/01/2021   11:01 AM 02/01/2020    3:59 PM 01/28/2020    2:26 PM 06/02/2019   11:03 AM  [place] in the past year? 0 0 0 0 0  Comment   [employer] Telephone Survey: data to providers prior to load  [employer] Survey: data to providers prior to load  Number falls in past yr: 0 0     Injury with Fall? 0 0     Risk for fall due to :  No Fall Risks     Follow up Falls evaluation completed;Education provided;Falls prevention discussed Falls evaluation completed  Falls evaluation completed     FALL RISK PREVENTION PERTAINING TO THE HOME:  Any stairs in or around the home? Yes  If so, are there any without handrails? No  Home free of loose throw rugs in walkways, pet beds, electrical cords, etc? Yes  Adequate lighting in your home to reduce risk of falls? Yes   ASSISTIVE DEVICES UTILIZED TO PREVENT FALLS:  Life alert? No  Use of a cane, walker or w/c? No  Grab bars in the bathroom? Yes  Shower chair or bench in shower? Yes  Elevated toilet seat or a handicapped toilet? No   TIMED UP AND GO:  Was the test performed? No .  Cognitive Function:    12/10/2017    8:59 AM 12/04/2016    8:35 AM 11/29/2015    2:22 PM  MMSE - Mini Mental State Exam  Orientation to time '5 5 5  '$ Orientation to Place '5 5 5  '$ Registration '3 3 3  '$ Attention/ Calculation 0 0 0  Recall '3 3 3  '$ Language- name 2 objects 0 0 0  Language- repeat '1 1 1  '$ Language- follow 3 step  command '3 3 3  '$ Language- read & follow direction 0 0 0  Write a sentence 0 0 0  Copy design 0 0 0  Total score '20 20 20        '$ 05/07/2022   12:06 PM 05/01/2021   11:02 AM  6CIT Screen  What Year? 0 points 0 points  What month? 0 points 0 points  What time? 0 points 0 points  Count back from 20 0 points 0 points  Months in reverse 0 points 0 points  Repeat phrase 0 points 0 points  Total Score 0 points 0 points    Immunizations Immunization History  Administered Date(s) Administered   COVID-19, mRNA, vaccine(Comirnaty)12 years and older 05/01/2022   Fluad Quad(high Dose 65+) 03/09/2019, 04/22/2020, 04/16/2022   Influenza Whole 04/30/2006   Influenza, High Dose Seasonal PF 04/09/2021   Influenza,inj,Quad PF,6+ Mos 04/22/2014, 05/23/2015, 04/15/2016, 05/01/2017, 04/02/2018   PFIZER(Purple Top)SARS-COV-2 Vaccination 08/13/2019, 09/03/2019   Pfizer Covid-19 Vaccine Bivalent Booster 89yr & up 03/16/2021, 10/29/2021   Pneumococcal Conjugate-13 11/16/2014   Pneumococcal Polysaccharide-23 12/12/2010   Td 07/20/2002, 06/22/2012   Zoster Recombinat (Shingrix) 11/29/2019, 02/04/2020   Zoster, Live 07/03/2012    TDAP status: Due, Education has been provided regarding the importance of this vaccine. Advised may receive this vaccine at local pharmacy or Health Dept. Aware to provide a copy of the vaccination record if obtained from local pharmacy or Health Dept. Verbalized acceptance and understanding.  Flu Vaccine status: Up to date  Pneumococcal vaccine status: Up to date  Covid-19 vaccine status: Completed vaccines  Qualifies for Shingles Vaccine? No   Zostavax completed Yes   Shingrix Completed?: Yes  Screening Tests Health Maintenance  Topic Date Due   COVID-19 Vaccine (5 - Pfizer risk series) 12/24/2021   MAMMOGRAM  05/10/2022   TETANUS/TDAP  06/22/2022   Medicare Annual Wellness (AWV)  05/08/2023   Pneumonia Vaccine 76 Years old  Completed   INFLUENZA VACCINE   Completed   DEXA SCAN  Completed   Hepatitis C Screening  Completed   Zoster Vaccines- Shingrix  Completed   HPV VACCINES  Aged Out   COLONOSCOPY (Pts 45-484yrInsurance coverage will need to be confirmed)  Discontinued    Health Maintenance  Health Maintenance Due  Topic Date Due   COVID-19 Vaccine (5 - Pfizer risk series) 12/24/2021    Colorectal cancer screening: No longer required.   Mammogram status: Completed 2022. Repeat every year  Bone Density status: Completed 2021. Results reflect: Bone density results: OSTEOPOROSIS. Repeat every 2 years.  Lung Cancer Screening: (Low Dose CT Chest recommended if Age 76-80ears, 30 pack-year currently smoking OR have quit w/in 15years.) does not qualify.   Lung Cancer Screening Referral:   Additional Screening:  Hepatitis C Screening: does not qualify; Completed 2017  Vision Screening: Recommended annual ophthalmology exams for early detection of glaucoma and other disorders of the eye. Is the patient up to date with their annual eye exam?  Yes  Who is the provider or what is the  name of the office in which the patient attends annual eye exams? [facility] If pt is not established with a provider, would they like to be referred to a provider to establish care? No .   Dental Screening: Recommended annual dental exams for proper oral hygiene  Community Resource Referral / Chronic Care Management: CRR required this visit?  No   CCM required this visit?  No      Plan:     I have personally reviewed and noted the following in the patient's chart:   Medical and social history Use of alcohol, tobacco or illicit drugs  Current medications and supplements including opioid prescriptions. Patient is not currently taking opioid prescriptions. Functional ability and status Nutritional status Physical activity Advanced directives List of other physicians Hospitalizations, surgeries, and ER visits in previous 12  months Vitals Screenings to include cognitive, depression, and falls Referrals and appointments  In addition, I have reviewed and discussed with patient certain preventive protocols, quality metrics, and best practice recommendations. A written personalized care plan for preventive services as well as general preventive health recommendations were provided to patient.     Leroy Kennedy, LPN   [id number]   Nurse Notes:

## 2022-05-07 NOTE — Patient Instructions (Signed)
Ms. Marie Kennedy , Thank you for taking time to come for your Medicare Wellness Visit. I appreciate your ongoing commitment to your health goals. Please review the following plan we discussed and let me know if I can assist you in the future.   These are the goals we discussed:  Goals      Increase physical activity     Starting 12/30/2018, I will continue to walk at least 60 minutes daily.      Patient Stated     Maintain walking schedule        This is a list of the screening recommended for you and due dates:  Health Maintenance  Topic Date Due   COVID-19 Vaccine (5 - Pfizer risk series) 12/24/2021   Mammogram  05/10/2022   Tetanus Vaccine  06/22/2022   Medicare Annual Wellness Visit  05/08/2023   Pneumonia Vaccine  Completed   Flu Shot  Completed   DEXA scan (bone density measurement)  Completed   Hepatitis C Screening: USPSTF Recommendation to screen - Ages 18-79 yo.  Completed   Zoster (Shingles) Vaccine  Completed   HPV Vaccine  Aged Out   Colon Cancer Screening  Discontinued    Advanced directives: Education provided  Conditions/risks identified:      Preventive Care 76 Years and Older, Female Preventive care refers to lifestyle choices and visits with your health care provider that can promote health and wellness. What does preventive care include? A yearly physical exam. This is also called an annual well check. Dental exams once or twice a year. Routine eye exams. Ask your health care provider how often you should have your eyes checked. Personal lifestyle choices, including: Daily care of your teeth and gums. Regular physical activity. Eating a healthy diet. Avoiding tobacco and drug use. Limiting alcohol use. Practicing safe sex. Taking low-dose aspirin every day. Taking vitamin and mineral supplements as recommended by your health care provider. What happens during an annual well check? The services and screenings done by your health care provider during  your annual well check will depend on your age, overall health, lifestyle risk factors, and family history of disease. Counseling  Your health care provider may ask you questions about your: Alcohol use. Tobacco use. Drug use. Emotional well-being. Home and relationship well-being. Sexual activity. Eating habits. History of falls. Memory and ability to understand (cognition). Work and work Statistician. Reproductive health. Screening  You may have the following tests or measurements: Height, weight, and BMI. Blood pressure. Lipid and cholesterol levels. These may be checked every 5 years, or more frequently if you are over 19 years old. Skin check. Lung cancer screening. You may have this screening every year starting at age 30 if you have a 30-pack-year history of smoking and currently smoke or have quit within the past 15 years. Fecal occult blood test (FOBT) of the stool. You may have this test every year starting at age 34. Flexible sigmoidoscopy or colonoscopy. You may have a sigmoidoscopy every 5 years or a colonoscopy every 10 years starting at age 70. Hepatitis C blood test. Hepatitis B blood test. Sexually transmitted disease (STD) testing. Diabetes screening. This is done by checking your blood sugar (glucose) after you have not eaten for a while (fasting). You may have this done every 1-3 years. Bone density scan. This is done to screen for osteoporosis. You may have this done starting at age 48. Mammogram. This may be done every 1-2 years. Talk to your health care provider about  how often you should have regular mammograms. Talk with your health care provider about your test results, treatment options, and if necessary, the need for more tests. Vaccines  Your health care provider may recommend certain vaccines, such as: Influenza vaccine. This is recommended every year. Tetanus, diphtheria, and acellular pertussis (Tdap, Td) vaccine. You may need a Td booster every 10  years. Zoster vaccine. You may need this after age 21. Pneumococcal 13-valent conjugate (PCV13) vaccine. One dose is recommended after age 2. Pneumococcal polysaccharide (PPSV23) vaccine. One dose is recommended after age 59. Talk to your health care provider about which screenings and vaccines you need and how often you need them. This information is not intended to replace advice given to you by your health care provider. Make sure you discuss any questions you have with your health care provider. Document Released: 07/21/2015 Document Revised: 03/13/2016 Document Reviewed: 04/25/2015 Elsevier Interactive Patient Education  2017 [place] Prevention in the Home Falls can cause injuries. They can happen to people of all ages. There are many things you can do to make your home safe and to help prevent falls. What can I do on the outside of my home? Regularly fix the edges of walkways and driveways and fix any cracks. Remove anything that might make you trip as you walk through a door, such as a raised step or threshold. Trim any bushes or trees on the path to your home. Use bright outdoor lighting. Clear any walking paths of anything that might make someone trip, such as rocks or tools. Regularly check to see if handrails are loose or broken. Make sure that both sides of any steps have handrails. Any raised decks and porches should have guardrails on the edges. Have any leaves, snow, or ice cleared regularly. Use sand or salt on walking paths during winter. Clean up any spills in your garage right away. This includes oil or grease spills. What can I do in the bathroom? Use night lights. Install grab bars by the toilet and in the tub and shower. Do not use towel bars as grab bars. Use non-skid mats or decals in the tub or shower. If you need to sit down in the shower, use a plastic, non-slip stool. Keep the floor dry. Clean up any water that spills on the floor as soon as it  happens. Remove soap buildup in the tub or shower regularly. Attach bath mats securely with double-sided non-slip rug tape. Do not have throw rugs and other things on the floor that can make you trip. What can I do in the bedroom? Use night lights. Make sure that you have a light by your bed that is easy to reach. Do not use any sheets or blankets that are too big for your bed. They should not hang down onto the floor. Have a firm chair that has side arms. You can use this for support while you get dressed. Do not have throw rugs and other things on the floor that can make you trip. What can I do in the kitchen? Clean up any spills right away. Avoid walking on wet floors. Keep items that you use a lot in easy-to-reach places. If you need to reach something above you, use a strong step stool that has a grab bar. Keep electrical cords out of the way. Do not use floor polish or wax that makes floors slippery. If you must use wax, use non-skid floor wax. Do not have throw rugs and other  things on the floor that can make you trip. What can I do with my stairs? Do not leave any items on the stairs. Make sure that there are handrails on both sides of the stairs and use them. Fix handrails that are broken or loose. Make sure that handrails are as long as the stairways. Check any carpeting to make sure that it is firmly attached to the stairs. Fix any carpet that is loose or worn. Avoid having throw rugs at the top or bottom of the stairs. If you do have throw rugs, attach them to the floor with carpet tape. Make sure that you have a light switch at the top of the stairs and the bottom of the stairs. If you do not have them, ask someone to add them for you. What else can I do to help prevent falls? Wear shoes that: Do not have high heels. Have rubber bottoms. Are comfortable and fit you well. Are closed at the toe. Do not wear sandals. If you use a stepladder: Make sure that it is fully opened.  Do not climb a closed stepladder. Make sure that both sides of the stepladder are locked into place. Ask someone to hold it for you, if possible. Clearly mark and make sure that you can see: Any grab bars or handrails. First and last steps. Where the edge of each step is. Use tools that help you move around (mobility aids) if they are needed. These include: Canes. Walkers. Scooters. Crutches. Turn on the lights when you go into a dark area. Replace any light bulbs as soon as they burn out. Set up your furniture so you have a clear path. Avoid moving your furniture around. If any of your floors are uneven, fix them. If there are any pets around you, be aware of where they are. Review your medicines with your doctor. Some medicines can make you feel dizzy. This can increase your chance of falling. Ask your doctor what other things that you can do to help prevent falls. This information is not intended to replace advice given to you by your health care provider. Make sure you discuss any questions you have with your health care provider. Document Released: 04/20/2009 Document Revised: 11/30/2015 Document Reviewed: 07/29/2014 Elsevier Interactive Patient Education  2017 [employer].

## 2022-05-09 ENCOUNTER — Telehealth: Payer: Self-pay | Admitting: Family Medicine

## 2022-05-09 NOTE — Telephone Encounter (Signed)
LVM for pt to rtn my call to schedule AWV with [facility] call back # [phone] 

## 2022-05-12 ENCOUNTER — Telehealth: Payer: Self-pay | Admitting: Family Medicine

## 2022-05-12 DIAGNOSIS — I10 Essential (primary) hypertension: Secondary | ICD-10-CM

## 2022-05-12 DIAGNOSIS — M810 Age-related osteoporosis without current pathological fracture: Secondary | ICD-10-CM

## 2022-05-12 NOTE — Telephone Encounter (Signed)
-----   Message from Ellamae Sia sent at 05/08/2022  9:47 AM EDT ----- Regarding: Lab orders for Monday, 11.6.23 Patient is scheduled for CPX labs, please order future labs, Thanks , Karna Christmas

## 2022-05-13 ENCOUNTER — Telehealth: Payer: Self-pay

## 2022-05-13 ENCOUNTER — Other Ambulatory Visit (INDEPENDENT_AMBULATORY_CARE_PROVIDER_SITE_OTHER): Payer: PPO

## 2022-05-13 DIAGNOSIS — I10 Essential (primary) hypertension: Secondary | ICD-10-CM

## 2022-05-13 DIAGNOSIS — M810 Age-related osteoporosis without current pathological fracture: Secondary | ICD-10-CM

## 2022-05-13 LAB — CBC WITH DIFFERENTIAL/PLATELET
Basophils Absolute: 0.1 10*3/uL (ref 0.0–0.1)
Basophils Relative: 1.1 % (ref 0.0–3.0)
Eosinophils Absolute: 0.3 10*3/uL (ref 0.0–0.7)
Eosinophils Relative: 6 % — ABNORMAL HIGH (ref 0.0–5.0)
HCT: 40.7 % (ref 36.0–46.0)
Hemoglobin: 13.4 g/dL (ref 12.0–15.0)
Lymphocytes Relative: 31.3 % (ref 12.0–46.0)
Lymphs Abs: 1.5 10*3/uL (ref 0.7–4.0)
MCHC: 33 g/dL (ref 30.0–36.0)
MCV: 91.9 fl (ref 78.0–100.0)
Monocytes Absolute: 0.5 10*3/uL (ref 0.1–1.0)
Monocytes Relative: 11.2 % (ref 3.0–12.0)
Neutro Abs: 2.4 10*3/uL (ref 1.4–7.7)
Neutrophils Relative %: 50.4 % (ref 43.0–77.0)
Platelets: 235 10*3/uL (ref 150.0–400.0)
RBC: 4.42 Mil/uL (ref 3.87–5.11)
RDW: 13.4 % (ref 11.5–15.5)
WBC: 4.7 10*3/uL (ref 4.0–10.5)

## 2022-05-13 LAB — COMPREHENSIVE METABOLIC PANEL
ALT: 19 U/L (ref 0–35)
AST: 21 U/L (ref 0–37)
Albumin: 4.4 g/dL (ref 3.5–5.2)
Alkaline Phosphatase: 59 U/L (ref 39–117)
BUN: 19 mg/dL (ref 6–23)
CO2: 31 mEq/L (ref 19–32)
Calcium: 10.1 mg/dL (ref 8.4–10.5)
Chloride: 98 mEq/L (ref 96–112)
Creatinine, Ser: 0.87 mg/dL (ref 0.40–1.20)
GFR: 64.71 mL/min (ref >60.00)
Glucose, Bld: 81 mg/dL (ref 70–99)
Potassium: 4.4 mEq/L (ref 3.5–5.1)
Sodium: 135 mEq/L (ref 135–145)
Total Bilirubin: 1.4 mg/dL — ABNORMAL HIGH (ref 0.2–1.2)
Total Protein: 7.1 g/dL (ref 6.0–8.3)

## 2022-05-13 LAB — TSH: TSH: 1.84 u[IU]/mL (ref 0.35–5.50)

## 2022-05-13 LAB — LIPID PANEL
Cholesterol: 195 mg/dL (ref 0–200)
HDL: 90.2 mg/dL (ref >39.00)
LDL Cholesterol: 90 mg/dL (ref 0–99)
NonHDL: 104.5
Total CHOL/HDL Ratio: 2
Triglycerides: 72 mg/dL (ref 0.0–149.0)
VLDL: 14.4 mg/dL (ref 0.0–40.0)

## 2022-05-13 LAB — VITAMIN D 25 HYDROXY (VIT D DEFICIENCY, FRACTURES): VITD: 103.42 ng/mL (ref 30.00–100.00)

## 2022-05-13 NOTE — Telephone Encounter (Signed)
Pt notified of lab results and Dr. Marliss Coots comments. Pt will hold all supplements with Vit D until appt

## 2022-05-13 NOTE — Telephone Encounter (Signed)
Marie Kennedy with [facility] lab calling critical Vit D result of 103.42.  recorded in critical lab finding book and sending note to Dr Glori Bickers and [place] CMA and will speak with [place]. Pt has CPX scheduled on 05/20/2022.

## 2022-05-13 NOTE — Telephone Encounter (Signed)
Please tell her to hold any supplements with vit D for now  We will disc at her appt

## 2022-05-20 ENCOUNTER — Ambulatory Visit (INDEPENDENT_AMBULATORY_CARE_PROVIDER_SITE_OTHER): Payer: PPO | Admitting: Family Medicine

## 2022-05-20 ENCOUNTER — Encounter: Payer: Self-pay | Admitting: Family Medicine

## 2022-05-20 VITALS — BP 118/60 | HR 66 | Temp 98.1°F | Ht 62.6 in | Wt 127.2 lb

## 2022-05-20 DIAGNOSIS — E2839 Other primary ovarian failure: Secondary | ICD-10-CM | POA: Diagnosis not present

## 2022-05-20 DIAGNOSIS — M810 Age-related osteoporosis without current pathological fracture: Secondary | ICD-10-CM

## 2022-05-20 DIAGNOSIS — Z0000 Encounter for general adult medical examination without abnormal findings: Secondary | ICD-10-CM

## 2022-05-20 DIAGNOSIS — Z1231 Encounter for screening mammogram for malignant neoplasm of breast: Secondary | ICD-10-CM

## 2022-05-20 DIAGNOSIS — L304 Erythema intertrigo: Secondary | ICD-10-CM | POA: Insufficient documentation

## 2022-05-20 DIAGNOSIS — I10 Essential (primary) hypertension: Secondary | ICD-10-CM

## 2022-05-20 MED ORDER — LISINOPRIL-HYDROCHLOROTHIAZIDE 10-12.5 MG PO TABS
0.5000 | ORAL_TABLET | Freq: Every day | ORAL | 3 refills | Status: DC
Start: 2022-05-20 — End: 2023-05-22

## 2022-05-20 MED ORDER — KETOCONAZOLE 2 % EX CREA: 1.0000 | TOPICAL_CREAM | Freq: Every day | CUTANEOUS | 2 refills | Status: DC | PRN

## 2022-05-20 NOTE — Assessment & Plan Note (Signed)
bp in fair control at this time  BP Readings from Last 1 Encounters:  05/20/22 118/60   No changes needed Most recent labs reviewed  Disc lifstyle change with low sodium diet and exercise  Plan to continue lisinopril hct 10-12.5 ng daily

## 2022-05-20 NOTE — Assessment & Plan Note (Signed)
Dexa 02/2020 due for 2 y re check  Ordered and pt will call to schedule  One fall and no fx  Disc fall prev  D level is high-inst to hold D and just take ca and D combo  Good exercise -end her to continue using weights

## 2022-05-20 NOTE — Progress Notes (Signed)
Subjective:    Patient ID: Marie Kennedy, female    DOB: [birth year], 76 y.o.   MRN: [id number]  HPI Here for health maintenance exam and to review chronic medical problems    Wt Readings from Last 3 Encounters:  05/20/22 127 lb 3.2 oz (57.7 kg)  05/01/21 123 lb 6.4 oz (56 kg)  01/28/20 122 lb 4 oz (55.5 kg)   22.82 kg/m  Walking - every am about 3 miles in an hour  Taking care of herself  Also uses 3 lb weights almost every day     Immunization History  Administered Date(s) Administered   COVID-19, mRNA, vaccine(Comirnaty)12 years and older 05/01/2022   Fluad Quad(high Dose 65+) 03/09/2019, 04/22/2020, 04/16/2022   Influenza Whole 04/30/2006   Influenza, High Dose Seasonal PF 04/09/2021   Influenza,inj,Quad PF,6+ Mos 04/22/2014, 05/23/2015, 04/15/2016, 05/01/2017, 04/02/2018   PFIZER(Purple Top)SARS-COV-2 Vaccination 08/13/2019, 09/03/2019   Pfizer Covid-19 Vaccine Bivalent Booster 53yr & up 03/16/2021, 10/29/2021   Pneumococcal Conjugate-13 11/16/2014   Pneumococcal Polysaccharide-23 12/12/2010   Respiratory Syncytial Virus Vaccine,Recomb Aduvanted(Arexvy) 05/15/2022   Td 07/20/2002, 06/22/2012   Zoster Recombinat (Shingrix) 11/29/2019, 02/04/2020   Zoster, Live 07/03/2012   Health Maintenance Due  Topic Date Due   COVID-19 Vaccine (5 - Pfizer risk series) 12/24/2021   MAMMOGRAM  05/10/2022    Mammogram 05/2021, ready to schedule  Self breast exam -no lumps  Mother had breast cancer in her 529s  Colonoscopy 10/2019  No bowel changes   Dexa  02/2020- stable  OP  Falls : had fallen when walking /no injuries -when not paying attention   Fractures:none  Supplements  D level was high this draw  She was inst to hold her D Exercise : good walking and weights   Dermatology care : utd with nothing new   HTN bp is stable today  No cp or palpitations or headaches or edema  No side effects to medicines  BP Readings from Last 3 Encounters:  05/20/22 118/60   05/01/21 126/72  01/28/20 (!) 109/60     Lisinopril hct 10-12.5 mg daily   Lab Results  Component Value Date   CREATININE 0.87 05/13/2022   BUN 19 05/13/2022   NA 135 05/13/2022   K 4.4 05/13/2022   CL 98 05/13/2022   CO2 31 05/13/2022    Lab Results  Component Value Date   WBC 4.7 05/13/2022   HGB 13.4 05/13/2022   HCT 40.7 05/13/2022   MCV 91.9 05/13/2022   PLT 235.0 05/13/2022   Lab Results  Component Value Date   TSH 1.84 05/13/2022   Lab Results  Component Value Date   ALT 19 05/13/2022   AST 21 05/13/2022   ALKPHOS 59 05/13/2022   BILITOT 1.4 (H) 05/13/2022    Cholesterol Lab Results  Component Value Date   CHOL 195 05/13/2022   CHOL 183 05/01/2021   CHOL 191 01/21/2020   Lab Results  Component Value Date   HDL 90.20 05/13/2022   HDL 83.90 05/01/2021   HDL 79.30 01/21/2020   Lab Results  Component Value Date   LDLCALC 90 05/13/2022   LDLCALC 87 05/01/2021   LDLCALC 97 01/21/2020   Lab Results  Component Value Date   TRIG 72.0 05/13/2022   TRIG 61.0 05/01/2021   TRIG 74.0 01/21/2020   Lab Results  Component Value Date   CHOLHDL 2 05/13/2022   CHOLHDL 2 05/01/2021   CHOLHDL 2 01/21/2020   No results found for: "LDLDIRECT"  Very good profile Eats fairly healthy and has good genes   Patient Active Problem List   Diagnosis Date Noted   Intertrigo 05/20/2022   Greater trochanteric pain syndrome of left lower extremity 05/01/2021   Medicare annual wellness visit, subsequent 01/28/2020   Routine general medical examination at a health care facility 11/22/2015   Estrogen deficiency 11/22/2015   Exposure to communicable disease 11/22/2015   Personal history of other malignant neoplasm of skin 06/01/2013   Colon cancer screening 06/22/2012   Hyperbilirubinemia 06/22/2012   Osteoporosis 12/12/2010   Screening mammogram, encounter for 12/04/2010   ALLERGIC RHINITIS, SEASONAL 11/13/2009   Essential hypertension 05/04/2007   IBS  05/04/2007   ROSACEA 05/04/2007   Past Medical History:  Diagnosis Date   Allergy    Basal cell adenoma 11/14   face   Basal cell carcinoma 03/11/2013   Right forehead. Nodular pattern. Excised: 05/14/2016   Cancer (HCC)    skin   Fibroids 1993   cysts endometriosis   GERD (gastroesophageal reflux disease)    History of basal cell carcinoma (BCC) 05/14/2016   right superior forehead, excision   Hx of basal cell carcinoma 03/11/2013   Right ala of nose. BCC with sclerosis   Hx of basal cell carcinoma 01/05/2015   Left lateral proximal elbow. Superficial.   Hypertension    PCP   [facility] Glori Bickers at [facility]   IBS (irritable bowel syndrome)    Rosacea    Past Surgical History:  Procedure Laterality Date   ABDOMINAL HYSTERECTOMY  1993   BASAL CELL CARCINOMA EXCISION  11/14   face   BREAST EXCISIONAL BIOPSY Right 1970   Negative   BREAST SURGERY Right 1970's   lumpectomy no cancer   CATARACT EXTRACTION W/PHACO Left 05/19/2017   Procedure: CATARACT EXTRACTION PHACO AND INTRAOCULAR LENS PLACEMENT ([place]) LEFT;  Surgeon: Leandrew Koyanagi, MD;  Location: [facility];  Service: Ophthalmology;  Laterality: Left;   CATARACT EXTRACTION W/PHACO Right 06/18/2017   Procedure: CATARACT EXTRACTION PHACO AND INTRAOCULAR LENS PLACEMENT ([place])  COMPLICATED  RIGHT;  Surgeon: Leandrew Koyanagi, MD;  Location: [facility];  Service: Ophthalmology;  Laterality: Right;   CHOLECYSTECTOMY  06/18/2011   Procedure: LAPAROSCOPIC CHOLECYSTECTOMY WITH INTRAOPERATIVE CHOLANGIOGRAM;  Surgeon: Joyice Faster. Cornett, MD;  Location: [facility];  Service: General;  Laterality: N/A;  lap chole with IOC   OOPHORECTOMY     TONSILLECTOMY  1953   Social History   Tobacco Use   Smoking status: Never   Smokeless tobacco: Never  Vaping Use   Vaping Use: Never used  Substance Use Topics   Alcohol use: No    Alcohol/week: 0.0 standard drinks of alcohol   Drug use: No   Family History  Problem  Relation Age of Onset   Hypertension Mother    Cancer Mother        breast, uterine, and cervical   Breast cancer Mother 19   Heart disease Father        CAD   Hypertension Father    Diabetes Father        TYPE II   Cancer Father        brain   Colon cancer Neg Hx    Esophageal cancer Neg Hx    Rectal cancer Neg Hx    Stomach cancer Neg Hx    No Known Allergies Current Outpatient Medications on File Prior to Visit  Medication Sig Dispense Refill   clotrimazole-betamethasone (LOTRISONE) cream APPLY TWICE A DAY  TO AFFECTED FINGERS AS NEEDED 15 g 2   DiphenhydrAMINE HCl (ALLERGY MED PO) Take 25 mg by mouth daily as needed. Allergies      hyoscyamine (LEVSIN SL) 0.125 MG SL tablet PLACE 1 TABLET UNDER THE TONGUE 2 TIMES DAILY AS NEEDED FOR ABDOMINAL CRAMPING 180 tablet 0   Ivermectin 1 % CREA Apply 1 Application topically at bedtime. 30 g 11   Calcium Carbonate-Vit D-Min 600-400 MG-UNIT TABS Take 1 tablet by mouth daily.  (Patient not taking: Reported on 05/20/2022)     No current facility-administered medications on file prior to visit.    Review of Systems  Constitutional:  Negative for activity change, appetite change, fatigue, fever and unexpected weight change.  HENT:  Negative for congestion, ear pain, rhinorrhea, sinus pressure and sore throat.   Eyes:  Negative for pain, redness and visual disturbance.  Respiratory:  Negative for cough, shortness of breath and wheezing.   Cardiovascular:  Negative for chest pain and palpitations.  Gastrointestinal:  Negative for abdominal pain, blood in stool, constipation and diarrhea.  Endocrine: Negative for polydipsia and polyuria.  Genitourinary:  Negative for dysuria, frequency and urgency.  Musculoskeletal:  Negative for arthralgias, back pain and myalgias.  Skin:  Negative for pallor and rash.  Allergic/Immunologic: Negative for environmental allergies.  Neurological:  Negative for dizziness, syncope and headaches.   Hematological:  Negative for adenopathy. Does not bruise/bleed easily.  Psychiatric/Behavioral:  Negative for decreased concentration and dysphoric mood. The patient is not nervous/anxious.        Objective:   Physical Exam Constitutional:      General: She is not in acute distress.    Appearance: Normal appearance. She is well-developed and normal weight. She is not ill-appearing or diaphoretic.  HENT:     Head: Normocephalic and atraumatic.     Right Ear: Tympanic membrane, ear canal and external ear normal.     Left Ear: Tympanic membrane, ear canal and external ear normal.     Nose: Nose normal. No congestion.     Mouth/Throat:     Mouth: Mucous membranes are moist.     Pharynx: Oropharynx is clear. No posterior oropharyngeal erythema.  Eyes:     General: No scleral icterus.    Extraocular Movements: Extraocular movements intact.     Conjunctiva/sclera: Conjunctivae normal.     Pupils: Pupils are equal, round, and reactive to light.  Neck:     Thyroid: No thyromegaly.     Vascular: No carotid bruit or JVD.  Cardiovascular:     Rate and Rhythm: Normal rate and regular rhythm.     Pulses: Normal pulses.     Heart sounds: Normal heart sounds.     No gallop.  Pulmonary:     Effort: Pulmonary effort is normal. No respiratory distress.     Breath sounds: Normal breath sounds. No wheezing.     Comments: Good air exch Chest:     Chest wall: No tenderness.  Abdominal:     General: Bowel sounds are normal. There is no distension or abdominal bruit.     Palpations: Abdomen is soft. There is no mass.     Tenderness: There is no abdominal tenderness.     Hernia: No hernia is present.  Genitourinary:    Comments: Breast exam: No mass, nodules, thickening, tenderness, bulging, retraction, inflamation, nipple discharge or skin changes noted.  No axillary or clavicular LA.     Musculoskeletal:        General: No tenderness.  Normal range of motion.     Cervical back: Normal range of  motion and neck supple. No rigidity. No muscular tenderness.     Right lower leg: No edema.     Left lower leg: No edema.     Comments: No kyphosis   Lymphadenopathy:     Cervical: No cervical adenopathy.  Skin:    General: Skin is warm and dry.     Coloration: Skin is not pale.     Findings: No erythema or rash.     Comments: Fair  Some lentigines scattered   Bright red rash under bilat breasts with satellite lesions resembling yeast dermatitis     Neurological:     Mental Status: She is alert. Mental status is at baseline.     Cranial Nerves: No cranial nerve deficit.     Motor: No abnormal muscle tone.     Coordination: Coordination normal.     Gait: Gait normal.     Deep Tendon Reflexes: Reflexes are normal and symmetric. Reflexes normal.  Psychiatric:        Mood and Affect: Mood normal.        Cognition and Memory: Cognition and memory normal.           Assessment & Plan:   Problem List Items Addressed This Visit       Cardiovascular and Mediastinum   Essential hypertension    bp in fair control at this time  BP Readings from Last 1 Encounters:  05/20/22 118/60  No changes needed Most recent labs reviewed  Disc lifstyle change with low sodium diet and exercise  Plan to continue lisinopril hct 10-12.5 ng daily       Relevant Medications   lisinopril-hydrochlorothiazide (ZESTORETIC) 10-12.5 MG tablet     Musculoskeletal and Integument   Intertrigo    Under both breasts  Resembling candidal infx Tx with ketoconazole Inst to keep areas clean and dry  Handout given Update if not starting to improve in a week or if worsening        Osteoporosis    Dexa 02/2020 due for 2 y re check  Ordered and pt will call to schedule  One fall and no fx  Disc fall prev  D level is high-inst to hold D and just take ca and D combo  Good exercise -end her to continue using weights         Other   Estrogen deficiency   Relevant Orders   DG Bone Density   Routine  general medical examination at a health care facility - Primary    Reviewed health habits including diet and exercise and skin cancer prevention Reviewed appropriate screening tests for age  Also reviewed health mt list, fam hx and immunization status , as well as social and family history   See HPI Labs reviewed  Mammogram and dexa ordered-pt will call to schedule Colonoscopy utd 10/2019 One fall , no inj, disc fall prev        Screening mammogram, encounter for    Mammogram ordered Pt will schedule Nl breast exam today      Relevant Orders   MM 3D SCREEN BREAST BILATERAL

## 2022-05-20 NOTE — Assessment & Plan Note (Signed)
Mammogram ordered Pt will schedule Nl breast exam today

## 2022-05-20 NOTE — Assessment & Plan Note (Signed)
Reviewed health habits including diet and exercise and skin cancer prevention Reviewed appropriate screening tests for age  Also reviewed health mt list, fam hx and immunization status , as well as social and family history   See HPI Labs reviewed  Mammogram and dexa ordered-pt will call to schedule Colonoscopy utd 10/2019 One fall , no inj, disc fall prev

## 2022-05-20 NOTE — Assessment & Plan Note (Signed)
Under both breasts  Resembling candidal infx Tx with ketoconazole Inst to keep areas clean and dry  Handout given Update if not starting to improve in a week or if worsening

## 2022-05-20 NOTE — Patient Instructions (Addendum)
You don't need as much vitamin D Take your calcium plus D supplement  Hold the other until next time   I think the rash under breasts is from yeast  Use the ketoconazole as needed  Keep dry (dry well after bathing)  Update if not starting to improve in a week or if worsening       Call and schedule your mammogram and dexa at [facility]  Please call the location of your choice from the menu below to schedule your Mammogram and/or Bone Density appointment.    [facility]                      Phone:  [phone], [place] [address]                                                             Services: Traditional and 3D Mammogram, [place] [facility]                 Phone: [phone] [address], [place] [address]    Service: Bone Density ONLY   *this site does NOT perform mammograms  [facility]                        Phone:  [phone] [address], [place] [address]                                            Services:  3D Mammogram and [place] at [facility]   Phone:  [phone]   [place],  [address]  Services: 3D Mammogram and Bone Density  [facility] [place] ([facility])  Phone:  [phone]   [address]                        [place], [place] [address]                                              Services:  3D Mammogram and Bone Density  

## 2022-05-28 ENCOUNTER — Other Ambulatory Visit: Payer: Self-pay | Admitting: Dermatology

## 2022-06-18 ENCOUNTER — Other Ambulatory Visit: Payer: Self-pay

## 2022-06-18 MED ORDER — CLOTRIMAZOLE-BETAMETHASONE 1-0.05 % EX CREA: TOPICAL_CREAM | CUTANEOUS | 0 refills | Status: DC

## 2022-06-18 NOTE — Progress Notes (Signed)
RF request approved. Patient uses on finger PRN flare. aw

## 2022-07-30 ENCOUNTER — Ambulatory Visit
Admission: RE | Admit: 2022-07-30 | Discharge: 2022-07-30 | Disposition: A | Discharge status: Home / Self Care | Payer: PPO | Source: Ambulatory Visit | Attending: Family Medicine | Admitting: Family Medicine

## 2022-07-30 DIAGNOSIS — E2839 Other primary ovarian failure: Secondary | ICD-10-CM | POA: Diagnosis present

## 2022-07-30 DIAGNOSIS — Z1231 Encounter for screening mammogram for malignant neoplasm of breast: Secondary | ICD-10-CM | POA: Insufficient documentation

## 2022-07-31 ENCOUNTER — Encounter: Payer: Self-pay | Admitting: *Deleted

## 2022-08-01 ENCOUNTER — Other Ambulatory Visit: Payer: Self-pay | Admitting: Family Medicine

## 2022-08-01 DIAGNOSIS — R928 Other abnormal and inconclusive findings on diagnostic imaging of breast: Secondary | ICD-10-CM

## 2022-08-01 DIAGNOSIS — N6489 Other specified disorders of breast: Secondary | ICD-10-CM

## 2022-08-06 ENCOUNTER — Ambulatory Visit
Admission: RE | Admit: 2022-08-06 | Discharge: 2022-08-06 | Disposition: A | Discharge status: Home / Self Care | Payer: PPO | Source: Ambulatory Visit | Attending: Family Medicine | Admitting: Family Medicine

## 2022-08-06 DIAGNOSIS — N6489 Other specified disorders of breast: Secondary | ICD-10-CM

## 2022-08-06 DIAGNOSIS — R928 Other abnormal and inconclusive findings on diagnostic imaging of breast: Secondary | ICD-10-CM

## 2023-03-17 ENCOUNTER — Ambulatory Visit (INDEPENDENT_AMBULATORY_CARE_PROVIDER_SITE_OTHER): Payer: PPO

## 2023-03-17 VITALS — Wt 127.0 lb

## 2023-03-17 DIAGNOSIS — Z0000 Encounter for general adult medical examination without abnormal findings: Secondary | ICD-10-CM | POA: Diagnosis not present

## 2023-03-17 NOTE — Progress Notes (Signed)
Subjective:   Marie Kennedy is a 77 y.o. female who presents for Medicare Annual (Subsequent) preventive examination.  Visit Complete: Virtual  I connected with  Marie Kennedy on 03/17/23 by a audio enabled telemedicine application and verified that I am speaking with the correct person using two identifiers.  Patient Location: Home  Provider Location: Office/Clinic  I discussed the limitations of evaluation and management by telemedicine. The patient expressed understanding and agreed to proceed.    Vital Signs: Unable to obtain new vitals due to this being a telehealth visit.   Review of Systems     Cardiac Risk Factors include: advanced age (>62men, >52 women);hypertension     Objective:    Today's Vitals   03/17/23 1432  Weight: 127 lb (57.6 kg)   Body mass index is 22.79 kg/m.     03/17/2023    2:37 PM 05/07/2022   12:03 PM 12/30/2018    8:32 AM 12/10/2017    9:00 AM 06/18/2017    8:44 AM 05/19/2017    9:13 AM 12/04/2016    8:47 AM  Advanced Directives  Does Patient Have a Medical Advance Directive? No No No No No No No  Would patient like information on creating a medical advance directive? No - Patient declined No - Patient declined No - Patient declined No - Patient declined No - Patient declined No - Patient declined No - Patient declined    Current Medications (verified) Outpatient Encounter Medications as of 03/17/2023  Medication Sig   Calcium Carbonate-Vit D-Min 600-400 MG-UNIT TABS Take 1 tablet by mouth daily.   clotrimazole-betamethasone (LOTRISONE) cream APPLY TWICE A DAY TO AFFECTED FINGERS AS NEEDED   DiphenhydrAMINE HCl (ALLERGY MED PO) Take 25 mg by mouth daily as needed. Allergies    hyoscyamine (LEVSIN SL) 0.125 MG SL tablet PLACE 1 TABLET UNDER THE TONGUE 2 TIMES DAILY AS NEEDED FOR ABDOMINAL CRAMPING   Ivermectin 1 % CREA Apply 1 Application topically at bedtime.   ketoconazole (NIZORAL) 2 % cream Apply 1 Application topically daily as  needed for irritation. For yeast rash   lisinopril-hydrochlorothiazide (ZESTORETIC) 10-12.5 MG tablet Take 0.5 tablets by mouth daily.   No facility-administered encounter medications on file as of 03/17/2023.    Allergies (verified) Patient has no known allergies.   History: Past Medical History:  Diagnosis Date   Allergy    Basal cell adenoma 11/14   face   Basal cell carcinoma 03/11/2013   Right forehead. Nodular pattern. Excised: 05/14/2016   Cancer (HCC)    skin   Fibroids 1993   cysts endometriosis   GERD (gastroesophageal reflux disease)    History of basal cell carcinoma (BCC) 05/14/2016   right superior forehead, excision   Hx of basal cell carcinoma 03/11/2013   Right ala of nose. BCC with sclerosis   Hx of basal cell carcinoma 01/05/2015   Left lateral proximal elbow. Superficial.   Hypertension    PCP   [facility] Milinda Antis [facility]   IBS (irritable bowel syndrome)    Rosacea    Past Surgical History:  Procedure Laterality Date   ABDOMINAL HYSTERECTOMY  1993   BASAL CELL CARCINOMA EXCISION  11/14   face   BREAST EXCISIONAL BIOPSY Right 1970   Negative   BREAST SURGERY Right 1970's   lumpectomy no cancer   CATARACT EXTRACTION W/PHACO Left 05/19/2017   Procedure: CATARACT EXTRACTION PHACO AND INTRAOCULAR LENS PLACEMENT (IOC) LEFT;  Surgeon: Lockie Mola, MD;  Location: [facility];  Service: Ophthalmology;  Laterality: Left;   CATARACT EXTRACTION W/PHACO Right 06/18/2017   Procedure: CATARACT EXTRACTION PHACO AND INTRAOCULAR LENS PLACEMENT (IOC)  COMPLICATED  RIGHT;  Surgeon: Lockie Mola, MD;  Location: [facility];  Service: Ophthalmology;  Laterality: Right;   CHOLECYSTECTOMY  06/18/2011   Procedure: LAPAROSCOPIC CHOLECYSTECTOMY WITH INTRAOPERATIVE CHOLANGIOGRAM;  Surgeon: Clovis Pu. Cornett, MD;  Location: [facility];  Service: General;  Laterality: N/A;  lap chole with IOC   OOPHORECTOMY     TONSILLECTOMY  1953   Family  History  Problem Relation Age of Onset   Hypertension Mother    Cancer Mother        breast, uterine, and cervical   Breast cancer Mother 33   Heart disease Father        CAD   Hypertension Father    Diabetes Father        TYPE II   Cancer Father        brain   Colon cancer Neg Hx    Esophageal cancer Neg Hx    Rectal cancer Neg Hx    Stomach cancer Neg Hx    Social History   Socioeconomic History   Marital status: Married    Spouse name: Not on file   Number of children: Not on file   Years of education: Not on file   Highest education level: Not on file  Occupational History   Not on file  Tobacco Use   Smoking status: Never   Smokeless tobacco: Never  Vaping Use   Vaping status: Never Used  Substance and Sexual Activity   Alcohol use: No    Alcohol/week: 0.0 standard drinks of alcohol   Drug use: No   Sexual activity: Yes  Other Topics Concern   Not on file  Social History Narrative   Not on file   Social Determinants of Health   Financial Resource Strain: Low Risk  (03/17/2023)   Overall Financial Resource Strain (CARDIA)    Difficulty of Paying Living Expenses: Not hard at all  Food Insecurity: No Food Insecurity (03/17/2023)   Hunger Vital Sign    Worried About Running Out of Food in the Last Year: Never true    Ran Out of Food in the Last Year: Never true  Transportation Needs: No Transportation Needs (03/17/2023)   PRAPARE - Administrator, Civil Service (Medical): No    Lack of Transportation (Non-Medical): No  Physical Activity: Sufficiently Active (03/17/2023)   Exercise Vital Sign    Days of Exercise per Week: 5 days    Minutes of Exercise per Session: 60 min  Stress: No Stress Concern Present (03/17/2023)   [employer] of Occupational Health - Occupational Stress Questionnaire    Feeling of Stress : Not at all  Social Connections: Socially Isolated (03/17/2023)   Social Connection and Isolation Panel [NHANES]    Frequency of  Communication with Friends and Family: Once a week    Frequency of Social Gatherings with Friends and Family: Once a week    Attends Religious Services: Never    Database administrator or Organizations: No    Attends Engineer, structural: Never    Marital Status: Married    Tobacco Counseling Counseling given: Not Answered   Clinical Intake:  Pre-visit preparation completed: Yes  Pain : No/denies pain     BMI - recorded: 22.79 Nutritional Status: BMI of 19-24  Normal Nutritional Risks: None Diabetes: No  How  often do you need to have someone help you when you read instructions, pamphlets, or other written materials from your doctor or pharmacy?: 1 - Never  Interpreter Needed?: No  Information entered by :: Lanier Ensign, LPN   Activities of Daily Living    03/17/2023    2:33 PM 05/07/2022   12:09 PM  In your present state of health, do you have any difficulty performing the following activities:  Hearing? 1 1  Comment pt stated HOH   Vision? 0 0  Difficulty concentrating or making decisions? 0 0  Walking or climbing stairs? 0 0  Dressing or bathing? 0 0  Doing errands, shopping? 0 0  Preparing Food and eating ? N N  Using the Toilet? N N  In the past six months, have you accidently leaked urine? N N  Do you have problems with loss of bowel control? N N  Managing your Medications? N N  Managing your Finances? N N  Housekeeping or managing your Housekeeping? N N    Patient Care Team: Tower, Audrie Gallus, MD as PCP - General (Family Medicine) Deirdre Evener, MD as Referring Physician (Dermatology) Sherlyn Lees Lavella Hammock, MD as Referring Physician (Ophthalmology) Verdell Face, DDS as Referring Physician (Dentistry)  Indicate any recent Medical Services you may have received from other than [facility] providers in the past year (date may be approximate).     Assessment:   This is a routine wellness examination for Marie Kennedy.  Hearing/Vision  screen Hearing Screening - Comments:: Pt stated HOH  Vision Screening - Comments:: Pt follows up with [facility] for annual eye exams    Goals Addressed             This Visit's Progress    Patient Stated       Stay healthy        Depression Screen    03/17/2023    2:35 PM 05/07/2022   12:10 PM 05/01/2021   11:02 AM 12/30/2018    8:32 AM 12/10/2017    8:44 AM 12/04/2016    8:35 AM 11/29/2015    2:24 PM  PHQ 2/9 Scores  PHQ - 2 Score 0 0 0 0 0 0 0  PHQ- 9 Score  0  0 0      Fall Risk    03/17/2023    2:37 PM 05/07/2022   12:04 PM 05/01/2021   11:01 AM 02/01/2020    3:59 PM 01/28/2020    2:26 PM  Fall Risk   Falls in the past year? 0 0 0 0 0  Comment    Emmi Telephone Survey: data to providers prior to load   Number falls in past yr: 0 0 0    Injury with Fall? 0 0 0    Risk for fall due to : Impaired vision  No Fall Risks    Follow up Falls prevention discussed Falls evaluation completed;Education provided;Falls prevention discussed Falls evaluation completed  Falls evaluation completed    MEDICARE RISK AT HOME: Medicare Risk at Home Any stairs in or around the home?: Yes If so, are there any without handrails?: No Home free of loose throw rugs in walkways, pet beds, electrical cords, etc?: Yes Adequate lighting in your home to reduce risk of falls?: Yes Life alert?: No Use of a cane, walker or w/c?: No Grab bars in the bathroom?: No Shower chair or bench in shower?: Yes Elevated toilet seat or a handicapped toilet?: No  TIMED UP AND GO:  Was  the test performed?  No    Cognitive Function:    12/10/2017    8:59 AM 12/04/2016    8:35 AM 11/29/2015    2:22 PM  MMSE - Mini Mental State Exam  Orientation to time 5 5 5   Orientation to Place 5 5 5   Registration 3 3 3   Attention/ Calculation 0 0 0  Recall 3 3 3   Language- name 2 objects 0 0 0  Language- repeat 1 1 1   Language- follow 3 step command 3 3 3   Language- read & follow direction 0 0 0  Write a  sentence 0 0 0  Copy design 0 0 0  Total score 20 20 20         03/17/2023    2:38 PM 05/07/2022   12:06 PM 05/01/2021   11:02 AM  6CIT Screen  What Year? 0 points 0 points 0 points  What month? 0 points 0 points 0 points  What time? 0 points 0 points 0 points  Count back from 20 0 points 0 points 0 points  Months in reverse 0 points 0 points 0 points  Repeat phrase 0 points 0 points 0 points  Total Score 0 points 0 points 0 points    Immunizations Immunization History  Administered Date(s) Administered   COVID-19, mRNA, vaccine(Comirnaty)12 years and older 05/01/2022   Fluad Quad(high Dose 65+) 03/09/2019, 04/22/2020, 04/16/2022   Influenza Whole 04/30/2006   Influenza, High Dose Seasonal PF 04/09/2021   Influenza,inj,Quad PF,6+ Mos 04/22/2014, 05/23/2015, 04/15/2016, 05/01/2017, 04/02/2018   PFIZER(Purple Top)SARS-COV-2 Vaccination 08/13/2019, 09/03/2019   Pfizer Covid-19 Vaccine Bivalent Booster 24yrs & up 03/16/2021, 10/29/2021   Pneumococcal Conjugate-13 11/16/2014   Pneumococcal Polysaccharide-23 12/12/2010   Respiratory Syncytial Virus Vaccine,Recomb Aduvanted(Arexvy) 05/15/2022   Td 07/20/2002, 06/22/2012   Zoster Recombinant(Shingrix) 11/29/2019, 02/04/2020   Zoster, Live 07/03/2012    TDAP status: Due, Education has been provided regarding the importance of this vaccine. Advised may receive this vaccine at local pharmacy or Health Dept. Aware to provide a copy of the vaccination record if obtained from local pharmacy or Health Dept. Verbalized acceptance and understanding.  Flu Vaccine status: Due, Education has been provided regarding the importance of this vaccine. Advised may receive this vaccine at local pharmacy or Health Dept. Aware to provide a copy of the vaccination record if obtained from local pharmacy or Health Dept. Verbalized acceptance and understanding.  Pneumococcal vaccine status: Up to date  Covid-19 vaccine status: Information provided on how to  obtain vaccines.   Qualifies for Shingles Vaccine? Yes   Zostavax completed Yes   Shingrix Completed?: Yes  Screening Tests Health Maintenance  Topic Date Due   DTaP/Tdap/Td (3 - Tdap) 06/22/2022   INFLUENZA VACCINE  02/06/2023   COVID-19 Vaccine (6 - 2023-24 season) 03/09/2023   MAMMOGRAM  07/31/2023   Medicare Annual Wellness (AWV)  03/16/2024   Pneumonia Vaccine 83+ Years old  Completed   DEXA SCAN  Completed   Hepatitis C Screening  Completed   Zoster Vaccines- Shingrix  Completed   HPV VACCINES  Aged Out   Colonoscopy  Discontinued    Health Maintenance  Health Maintenance Due  Topic Date Due   DTaP/Tdap/Td (3 - Tdap) 06/22/2022   INFLUENZA VACCINE  02/06/2023   COVID-19 Vaccine (6 - 2023-24 season) 03/09/2023    Colorectal cancer screening: No longer required.   Mammogram status: Completed 07/30/22. Repeat every year  Bone Density status: Completed 07/30/22. Results reflect: Bone density results: OSTEOPOROSIS. Repeat every 2  years.   Additional Screening:  Hepatitis C Screening:  Completed 11/22/15  Vision Screening: Recommended annual ophthalmology exams for early detection of glaucoma and other disorders of the eye. Is the patient up to date with their annual eye exam?  Yes  Who is the provider or what is the name of the office in which the patient attends annual eye exams? [facility]  If pt is not established with a provider, would they like to be referred to a provider to establish care? No .   Dental Screening: Recommended annual dental exams for proper oral hygiene    Community Resource Referral / Chronic Care Management: CRR required this visit?  No   CCM required this visit?  No     Plan:     I have personally reviewed and noted the following in the patient's chart:   Medical and social history Use of alcohol, tobacco or illicit drugs  Current medications and supplements including opioid prescriptions. Patient is not currently taking  opioid prescriptions. Functional ability and status Nutritional status Physical activity Advanced directives List of other physicians Hospitalizations, surgeries, and ER visits in previous 12 months Vitals Screenings to include cognitive, depression, and falls Referrals and appointments  In addition, I have reviewed and discussed with patient certain preventive protocols, quality metrics, and best practice recommendations. A written personalized care plan for preventive services as well as general preventive health recommendations were provided to patient.     Marzella Schlein, LPN   10/12/4257   After Visit Summary: (MyChart) Due to this being a telephonic visit, the after visit summary with patients personalized plan was offered to patient via MyChart   Nurse Notes: none

## 2023-03-17 NOTE — Patient Instructions (Signed)
Ms. Ivins , Thank you for taking time to come for your Medicare Wellness Visit. I appreciate your ongoing commitment to your health goals. Please review the following plan we discussed and let me know if I can assist you in the future.   Referrals/Orders/Follow-Ups/Clinician Recommendations: stay healthy   This is a list of the screening recommended for you and due dates:  Health Maintenance  Topic Date Due   DTaP/Tdap/Td vaccine (3 - Tdap) 06/22/2022   Flu Shot  02/06/2023   COVID-19 Vaccine (6 - 2023-24 season) 03/09/2023   Mammogram  07/31/2023   Medicare Annual Wellness Visit  03/16/2024   Pneumonia Vaccine  Completed   DEXA scan (bone density measurement)  Completed   Hepatitis C Screening  Completed   Zoster (Shingles) Vaccine  Completed   HPV Vaccine  Aged Out   Colon Cancer Screening  Discontinued    Advanced directives: (Declined) Advance directive discussed with you today. Even though you declined this today, please call our office should you change your mind, and we can give you the proper paperwork for you to fill out.  Next Medicare Annual Wellness Visit scheduled for next year: Yes

## 2023-03-19 ENCOUNTER — Ambulatory Visit: Payer: PPO | Admitting: Dermatology

## 2023-03-19 DIAGNOSIS — D229 Melanocytic nevi, unspecified: Secondary | ICD-10-CM

## 2023-03-19 DIAGNOSIS — Z1283 Encounter for screening for malignant neoplasm of skin: Secondary | ICD-10-CM | POA: Diagnosis not present

## 2023-03-19 DIAGNOSIS — W908XXA Exposure to other nonionizing radiation, initial encounter: Secondary | ICD-10-CM | POA: Diagnosis not present

## 2023-03-19 DIAGNOSIS — Z79899 Other long term (current) drug therapy: Secondary | ICD-10-CM

## 2023-03-19 DIAGNOSIS — Z7189 Other specified counseling: Secondary | ICD-10-CM

## 2023-03-19 DIAGNOSIS — L814 Other melanin hyperpigmentation: Secondary | ICD-10-CM | POA: Diagnosis not present

## 2023-03-19 DIAGNOSIS — L821 Other seborrheic keratosis: Secondary | ICD-10-CM

## 2023-03-19 DIAGNOSIS — D2272 Melanocytic nevi of left lower limb, including hip: Secondary | ICD-10-CM

## 2023-03-19 DIAGNOSIS — L578 Other skin changes due to chronic exposure to nonionizing radiation: Secondary | ICD-10-CM

## 2023-03-19 DIAGNOSIS — L719 Rosacea, unspecified: Secondary | ICD-10-CM

## 2023-03-19 DIAGNOSIS — Z85828 Personal history of other malignant neoplasm of skin: Secondary | ICD-10-CM

## 2023-03-19 MED ORDER — METRONIDAZOLE 1 % EX GEL: Freq: Every day | CUTANEOUS | 3 refills | Status: DC

## 2023-03-19 MED ORDER — METRONIDAZOLE 0.75 % EX CREA: TOPICAL_CREAM | CUTANEOUS | 3 refills | Status: DC

## 2023-03-19 NOTE — Progress Notes (Signed)
Follow-Up Visit   Subjective  Marie Kennedy is a 77 y.o. female who presents for the following: Yearly Skin Cancer Screening and Full Body Skin Exam, hx of BCC.  Rosacea on her face treating with Ivermectin cream with a poor response, patient would like to restart Metronidazole cream and Metronidazole gel she rotate using with a good response. .   The patient presents for Total-Body Skin Exam (TBSE) for skin cancer screening and mole check. The patient has spots, moles and lesions to be evaluated, some may be new or changing and the patient may have concern these could be cancer.    The following portions of the chart were reviewed this encounter and updated as appropriate: medications, allergies, medical history  Review of Systems:  No other skin or systemic complaints except as noted in HPI or Assessment and Plan.  Objective  Well appearing patient in no apparent distress; mood and affect are within normal limits.  A full examination was performed including scalp, head, eyes, ears, nose, lips, neck, chest, axillae, abdomen, back, buttocks, bilateral upper extremities, bilateral lower extremities, hands, feet, fingers, toes, fingernails, and toenails. All findings within normal limits unless otherwise noted below.   Relevant physical exam findings are noted in the Assessment and Plan.    Assessment & Plan   SKIN CANCER SCREENING PERFORMED TODAY.  ACTINIC DAMAGE - Chronic condition, secondary to cumulative UV/sun exposure - diffuse scaly erythematous macules with underlying dyspigmentation - Recommend daily broad spectrum sunscreen SPF 30+ to sun-exposed areas, reapply every 2 hours as needed.  - Staying in the shade or wearing long sleeves, sun glasses (UVA+UVB protection) and wide brim hats (4-inch brim around the entire circumference of the hat) are also recommended for sun protection.  - Call for new or changing lesions.  LENTIGINES, SEBORRHEIC KERATOSES, HEMANGIOMAS -  Benign normal skin lesions - Benign-appearing - Call for any changes   MELANOCYTIC NEVI 0.2 x 0.3 cm light brown macule at the left plantar foot- see photo  - Benign appearing on exam today - Observation - Call clinic for new or changing moles - Recommend daily use of broad spectrum spf 30+ sunscreen to sun-exposed areas.   ROSACEA Exam Mid face erythema with telangiectasias on her face  Chronic and persistent condition with duration or expected duration over one year. Condition is symptomatic/ bothersome to patient. Not currently at goal.   Rosacea is a chronic progressive skin condition usually affecting the face of adults, causing redness and/or acne bumps. It is treatable but not curable. It sometimes affects the eyes (ocular rosacea) as well. It may respond to topical and/or systemic medication and can flare with stress, sun exposure, alcohol, exercise, topical steroids (including hydrocortisone/cortisone 10) and some foods.  Daily application of broad spectrum spf 30+ sunscreen to face is recommended to reduce flares.  Patient denies grittiness of the eyes  Treatment Plan Restart Metronidazole .75% lotion apply to face twice a day   Or  Restart Metronidazole gel apply to face qd    May consider skin medicinals Oxymetazoline 1% / Ivermectin 1% / Niacinamide 2% Cream in the future  HISTORY OF BASAL CELL CARCINOMA OF THE SKIN - No evidence of recurrence today - Recommend regular full body skin exams - Recommend daily broad spectrum sunscreen SPF 30+ to sun-exposed areas, reapply every 2 hours as needed.  - Call if any new or changing lesions are noted between office visits    Return in about 1 year (around 03/18/2024) for TBSE,  hx of BCC .  IAngelique Holm, CMA, am acting as scribe for Armida Sans, MD .   Documentation: I have reviewed the above documentation for accuracy and completeness, and I agree with the above.  Armida Sans, MD

## 2023-03-19 NOTE — Patient Instructions (Signed)

## 2023-03-20 ENCOUNTER — Telehealth: Payer: Self-pay

## 2023-03-20 MED ORDER — METRONIDAZOLE 0.75 % EX GEL
1.0000 | Freq: Every day | CUTANEOUS | 0 refills | Status: DC
Start: 2023-03-20 — End: 2023-04-22

## 2023-03-20 MED ORDER — METRONIDAZOLE 0.75 % EX CREA: TOPICAL_CREAM | CUTANEOUS | 0 refills | Status: DC

## 2023-03-20 NOTE — Telephone Encounter (Signed)
Patient called asking for Metronidazole Gel to be corrected from 1% to .75%. Also corrected prescriptions to 90 day supply. aw

## 2023-03-21 ENCOUNTER — Encounter: Payer: Self-pay | Admitting: Dermatology

## 2023-04-22 ENCOUNTER — Telehealth: Payer: Self-pay

## 2023-04-22 MED ORDER — METRONIDAZOLE 0.75 % EX GEL: 1.0000 | Freq: Every day | CUTANEOUS | 0 refills | Status: DC

## 2023-04-22 NOTE — Telephone Encounter (Signed)
Patient called requesting a 90 day supply of Metronidazole cream, ok for 135 gm sent to [facility]

## 2023-04-29 ENCOUNTER — Ambulatory Visit (INDEPENDENT_AMBULATORY_CARE_PROVIDER_SITE_OTHER): Payer: PPO

## 2023-04-29 DIAGNOSIS — Z23 Encounter for immunization: Secondary | ICD-10-CM

## 2023-05-13 ENCOUNTER — Telehealth: Payer: Self-pay | Admitting: Family Medicine

## 2023-05-13 DIAGNOSIS — M810 Age-related osteoporosis without current pathological fracture: Secondary | ICD-10-CM

## 2023-05-13 DIAGNOSIS — I10 Essential (primary) hypertension: Secondary | ICD-10-CM

## 2023-05-13 NOTE — Telephone Encounter (Signed)
-----   Message from Alvina Chou sent at 05/01/2023 11:55 AM EDT ----- Regarding: Lab orders for Fri, 11.8.24 Patient is scheduled for CPX labs, please order future labs, Thanks , Camelia Eng

## 2023-05-16 ENCOUNTER — Other Ambulatory Visit (INDEPENDENT_AMBULATORY_CARE_PROVIDER_SITE_OTHER): Payer: PPO

## 2023-05-16 DIAGNOSIS — I10 Essential (primary) hypertension: Secondary | ICD-10-CM | POA: Diagnosis not present

## 2023-05-16 DIAGNOSIS — M810 Age-related osteoporosis without current pathological fracture: Secondary | ICD-10-CM

## 2023-05-16 LAB — COMPREHENSIVE METABOLIC PANEL
ALT: 21 U/L (ref 0–35)
AST: 23 U/L (ref 0–37)
Albumin: 4.3 g/dL (ref 3.5–5.2)
Alkaline Phosphatase: 50 U/L (ref 39–117)
BUN: 18 mg/dL (ref 6–23)
CO2: 33 meq/L — ABNORMAL HIGH (ref 19–32)
Calcium: 10 mg/dL (ref 8.4–10.5)
Chloride: 100 meq/L (ref 96–112)
Creatinine, Ser: 0.94 mg/dL (ref 0.40–1.20)
GFR: 58.56 mL/min — ABNORMAL LOW (ref >60.00)
Glucose, Bld: 83 mg/dL (ref 70–99)
Potassium: 4 meq/L (ref 3.5–5.1)
Sodium: 138 meq/L (ref 135–145)
Total Bilirubin: 1.6 mg/dL — ABNORMAL HIGH (ref 0.2–1.2)
Total Protein: 6.9 g/dL (ref 6.0–8.3)

## 2023-05-16 LAB — VITAMIN D 25 HYDROXY (VIT D DEFICIENCY, FRACTURES): VITD: 56.76 ng/mL (ref 30.00–100.00)

## 2023-05-16 LAB — LIPID PANEL
Cholesterol: 204 mg/dL — ABNORMAL HIGH (ref 0–200)
HDL: 89.5 mg/dL (ref >39.00)
LDL Cholesterol: 98 mg/dL (ref 0–99)
NonHDL: 114.02
Total CHOL/HDL Ratio: 2
Triglycerides: 82 mg/dL (ref 0.0–149.0)
VLDL: 16.4 mg/dL (ref 0.0–40.0)

## 2023-05-16 LAB — CBC WITH DIFFERENTIAL/PLATELET
Basophils Absolute: 0.1 10*3/uL (ref 0.0–0.1)
Basophils Relative: 1 % (ref 0.0–3.0)
Eosinophils Absolute: 0.3 10*3/uL (ref 0.0–0.7)
Eosinophils Relative: 4.4 % (ref 0.0–5.0)
HCT: 40 % (ref 36.0–46.0)
Hemoglobin: 13.2 g/dL (ref 12.0–15.0)
Lymphocytes Relative: 29.1 % (ref 12.0–46.0)
Lymphs Abs: 1.7 10*3/uL (ref 0.7–4.0)
MCHC: 33 g/dL (ref 30.0–36.0)
MCV: 93.4 fL (ref 78.0–100.0)
Monocytes Absolute: 0.6 10*3/uL (ref 0.1–1.0)
Monocytes Relative: 10.4 % (ref 3.0–12.0)
Neutro Abs: 3.1 10*3/uL (ref 1.4–7.7)
Neutrophils Relative %: 55.1 % (ref 43.0–77.0)
Platelets: 223 10*3/uL (ref 150.0–400.0)
RBC: 4.28 Mil/uL (ref 3.87–5.11)
RDW: 13.7 % (ref 11.5–15.5)
WBC: 5.7 10*3/uL (ref 4.0–10.5)

## 2023-05-16 LAB — TSH: TSH: 1.55 u[IU]/mL (ref 0.35–5.50)

## 2023-05-23 ENCOUNTER — Ambulatory Visit (INDEPENDENT_AMBULATORY_CARE_PROVIDER_SITE_OTHER): Payer: PPO | Admitting: Family Medicine

## 2023-05-23 ENCOUNTER — Encounter: Payer: Self-pay | Admitting: Family Medicine

## 2023-05-23 ENCOUNTER — Other Ambulatory Visit: Payer: Self-pay | Admitting: Family Medicine

## 2023-05-23 VITALS — BP 118/74 | HR 61 | Temp 98.3°F | Ht 61.25 in | Wt 124.5 lb

## 2023-05-23 DIAGNOSIS — I10 Essential (primary) hypertension: Secondary | ICD-10-CM

## 2023-05-23 DIAGNOSIS — Z0001 Encounter for general adult medical examination with abnormal findings: Secondary | ICD-10-CM

## 2023-05-23 DIAGNOSIS — M810 Age-related osteoporosis without current pathological fracture: Secondary | ICD-10-CM

## 2023-05-23 DIAGNOSIS — Z0000 Encounter for general adult medical examination without abnormal findings: Secondary | ICD-10-CM

## 2023-05-23 DIAGNOSIS — H9193 Unspecified hearing loss, bilateral: Secondary | ICD-10-CM

## 2023-05-23 DIAGNOSIS — Z1211 Encounter for screening for malignant neoplasm of colon: Secondary | ICD-10-CM

## 2023-05-23 DIAGNOSIS — M25552 Pain in left hip: Secondary | ICD-10-CM | POA: Diagnosis not present

## 2023-05-23 DIAGNOSIS — K589 Irritable bowel syndrome without diarrhea: Secondary | ICD-10-CM

## 2023-05-23 DIAGNOSIS — H9190 Unspecified hearing loss, unspecified ear: Secondary | ICD-10-CM | POA: Insufficient documentation

## 2023-05-23 MED ORDER — HYOSCYAMINE SULFATE 0.125 MG SL SUBL: SUBLINGUAL_TABLET | SUBLINGUAL | 1 refills | Status: AC

## 2023-05-23 MED ORDER — LISINOPRIL-HYDROCHLOROTHIAZIDE 10-12.5 MG PO TABS
0.5000 | ORAL_TABLET | Freq: Every day | ORAL | 3 refills | Status: DC
Start: 2023-05-23 — End: 2024-06-02

## 2023-05-23 NOTE — Telephone Encounter (Signed)
Will route to the PA dpt

## 2023-05-23 NOTE — Patient Instructions (Addendum)
If you want to see sport med (Dr Patsy Lager) for your hip- just call for an appointment  Use voltaren gel as needed  Tylenol is ok also   Keep taking good care of yourself   When you are ready for hearing evaluation let us know

## 2023-05-23 NOTE — Telephone Encounter (Signed)
She has taken this in the past- can a PA be done?

## 2023-05-23 NOTE — Progress Notes (Unsigned)
Subjective:    Patient ID: Marie Kennedy, female    DOB: 04-05-1946, 77 y.o.   MRN: [id number]  HPI  Here for health maintenance exam and to review chronic medical problems   Wt Readings from Last 3 Encounters:  05/23/23 124 lb 8 oz (56.5 kg)  03/17/23 127 lb (57.6 kg)  05/20/22 127 lb 3.2 oz (57.7 kg)   23.33 kg/m  Vitals:   05/23/23 1003  BP: 118/74  Pulse: 61  Temp: 98.3 F (36.8 C)  SpO2: 99%    Immunization History  Administered Date(s) Administered   Fluad Quad(high Dose 65+) 03/09/2019, 04/22/2020, 04/16/2022   Fluad Trivalent(High Dose 65+) 04/29/2023   Influenza Whole 04/30/2006   Influenza, High Dose Seasonal PF 04/09/2021   Influenza,inj,Quad PF,6+ Mos 04/22/2014, 05/23/2015, 04/15/2016, 05/01/2017, 04/02/2018   PFIZER(Purple Top)SARS-COV-2 Vaccination 08/13/2019, 09/03/2019   Pfizer Covid-19 Vaccine Bivalent Booster 92yrs & up 03/16/2021, 10/29/2021   Pfizer(Comirnaty)Fall Seasonal Vaccine 12 years and older 05/01/2022   Pneumococcal Conjugate-13 11/16/2014   Pneumococcal Polysaccharide-23 12/12/2010   Respiratory Syncytial Virus Vaccine,Recomb Aduvanted(Arexvy) 05/15/2022   Td 07/20/2002, 06/22/2012   Zoster Recombinant(Shingrix) 11/29/2019, 02/04/2020   Zoster, Live 07/03/2012    There are no preventive care reminders to display for this patient.  Feels fair overall for age Hip / bursitis still bothers her  Uses voltaren gel Does not tolerate ice Is a walker   Hearing is worse/ declines audiology eval yet   Also IBS continues - with cramping (her mood affects this) Needs refill of hyoscamine  Mammogram due in January  Self breast exam- no lumps   Gyn health No problems   Colon cancer screening  Colonoscopy 2021   Bone health  Dexa  07/2022 osteoporosis  Falls- none  Fractures-none  Supplements  ca plus D Last vitamin D Lab Results  Component Value Date   VD25OH 56.76 05/16/2023  D level was high in the past   Exercise  : Walking daily  Weights for strength building   Dermatology care  Nothing new Past history of skin cancer Uses sun protection    Mood    03/17/2023    2:35 PM 05/07/2022   12:10 PM 05/01/2021   11:02 AM 12/30/2018    8:32 AM 12/10/2017    8:44 AM  Depression screen PHQ 2/9  Decreased Interest 0 0 0 0 0  Down, Depressed, Hopeless 0 0 0 0 0  PHQ - 2 Score 0 0 0 0 0  Altered sleeping  0  0 0  Tired, decreased energy  0  0 0  Change in appetite  0  0 0  Feeling bad or failure about yourself     0 0  Trouble concentrating  0  0 0  Moving slowly or fidgety/restless  0  0 0  Suicidal thoughts  0  0 0  PHQ-9 Score  0  0 0  Difficult doing work/chores  Not difficult at all  Not difficult at all Not difficult at all   HTN bp is stable today  No cp or palpitations or headaches or edema  No side effects to medicines  BP Readings from Last 3 Encounters:  05/23/23 118/74  05/20/22 118/60  05/01/21 126/72    Lisinopril hct 10-12.5 mg daily   Lab Results  Component Value Date   NA 138 05/16/2023   K 4.0 05/16/2023   CO2 33 (H) 05/16/2023   GLUCOSE 83 05/16/2023   BUN 18 05/16/2023   CREATININE 0.94  05/16/2023   CALCIUM 10.0 05/16/2023   GFR 58.56 (L) 05/16/2023   GFRNONAA 86 (L) 06/14/2011   Lab Results  Component Value Date   ALT 21 05/16/2023   AST 23 05/16/2023   ALKPHOS 50 05/16/2023   BILITOT 1.6 (H) 05/16/2023   Chronic high bili  Likely gilberts  Cholesterol Lab Results  Component Value Date   CHOL 204 (H) 05/16/2023   CHOL 195 05/13/2022   CHOL 183 05/01/2021   Lab Results  Component Value Date   HDL 89.50 05/16/2023   HDL 90.20 05/13/2022   HDL 83.90 05/01/2021   Lab Results  Component Value Date   LDLCALC 98 05/16/2023   LDLCALC 90 05/13/2022   LDLCALC 87 05/01/2021   Lab Results  Component Value Date   TRIG 82.0 05/16/2023   TRIG 72.0 05/13/2022   TRIG 61.0 05/01/2021   Lab Results  Component Value Date   CHOLHDL 2 05/16/2023    CHOLHDL 2 05/13/2022   CHOLHDL 2 05/01/2021   No results found for: "LDLDIRECT"  Eats well  No history of vascular issues   Other labs Lab Results  Component Value Date   WBC 5.7 05/16/2023   HGB 13.2 05/16/2023   HCT 40.0 05/16/2023   MCV 93.4 05/16/2023   PLT 223.0 05/16/2023   Lab Results  Component Value Date   TSH 1.55 05/16/2023      Patient Active Problem List   Diagnosis Date Noted   Intertrigo 05/20/2022   Greater trochanteric pain syndrome of left lower extremity 05/01/2021   Routine general medical examination at a health care facility 11/22/2015   Estrogen deficiency 11/22/2015   Exposure to communicable disease 11/22/2015   Personal history of other malignant neoplasm of skin 06/01/2013   Colon cancer screening 06/22/2012   Hyperbilirubinemia 06/22/2012   Osteoporosis 12/12/2010   Screening mammogram, encounter for 12/04/2010   ALLERGIC RHINITIS, SEASONAL 11/13/2009   Essential hypertension 05/04/2007   IBS 05/04/2007   ROSACEA 05/04/2007   Past Medical History:  Diagnosis Date   Allergy    Basal cell adenoma 11/14   face   Basal cell carcinoma 03/11/2013   Right forehead. Nodular pattern. Excised: 05/14/2016   Cancer (HCC)    skin   Fibroids 1993   cysts endometriosis   GERD (gastroesophageal reflux disease)    History of basal cell carcinoma (BCC) 05/14/2016   right superior forehead, excision   Hx of basal cell carcinoma 03/11/2013   Right ala of nose. BCC with sclerosis   Hx of basal cell carcinoma 01/05/2015   Left lateral proximal elbow. Superficial.   Hypertension    PCP   Dr Milinda [facility]   IBS (irritable bowel syndrome)    Rosacea    Past Surgical History:  Procedure Laterality Date   ABDOMINAL HYSTERECTOMY  1993   BASAL CELL CARCINOMA EXCISION  11/14   face   BREAST EXCISIONAL BIOPSY Right 1970   Negative   BREAST SURGERY Right 1970's   lumpectomy no cancer   CATARACT EXTRACTION W/PHACO Left 05/19/2017    Procedure: CATARACT EXTRACTION PHACO AND INTRAOCULAR LENS PLACEMENT (IOC) LEFT;  Surgeon: Lockie Mola, MD;  Location: [facility];  Service: Ophthalmology;  Laterality: Left;   CATARACT EXTRACTION W/PHACO Right 06/18/2017   Procedure: CATARACT EXTRACTION PHACO AND INTRAOCULAR LENS PLACEMENT (IOC)  COMPLICATED  RIGHT;  Surgeon: Lockie Mola, MD;  Location: [facility];  Service: Ophthalmology;  Laterality: Right;   CHOLECYSTECTOMY  06/18/2011   Procedure:  LAPAROSCOPIC CHOLECYSTECTOMY WITH INTRAOPERATIVE CHOLANGIOGRAM;  Surgeon: Clovis Pu. Cornett, MD;  Location: [facility];  Service: General;  Laterality: N/A;  lap chole with IOC   OOPHORECTOMY     TONSILLECTOMY  1953   Social History   Tobacco Use   Smoking status: Never   Smokeless tobacco: Never  Vaping Use   Vaping status: Never Used  Substance Use Topics   Alcohol use: No    Alcohol/week: 0.0 standard drinks of alcohol   Drug use: No   Family History  Problem Relation Age of Onset   Hypertension Mother    Cancer Mother        breast, uterine, and cervical   Breast cancer Mother 53   Heart disease Father        CAD   Hypertension Father    Diabetes Father        TYPE II   Cancer Father        brain   Colon cancer Neg Hx    Esophageal cancer Neg Hx    Rectal cancer Neg Hx    Stomach cancer Neg Hx    No Known Allergies Current Outpatient Medications on File Prior to Visit  Medication Sig Dispense Refill   Calcium Carbonate-Vit D-Min 600-400 MG-UNIT TABS Take 1 tablet by mouth daily.     clotrimazole-betamethasone (LOTRISONE) cream APPLY TWICE A DAY TO AFFECTED FINGERS AS NEEDED 15 g 0   DiphenhydrAMINE HCl (ALLERGY MED PO) Take 25 mg by mouth daily as needed. Allergies      hyoscyamine (LEVSIN SL) 0.125 MG SL tablet PLACE 1 TABLET UNDER THE TONGUE 2 TIMES DAILY AS NEEDED FOR ABDOMINAL CRAMPING 180 tablet 0   lisinopril-hydrochlorothiazide (ZESTORETIC) 10-12.5 MG tablet Take 0.5 tablets by  mouth daily. 45 tablet 3   metroNIDAZOLE (METROCREAM) 0.75 % cream Apply to face twice a day 135 g 0   metroNIDAZOLE (METROGEL) 0.75 % gel Apply 1 Application topically daily. 135 g 0   No current facility-administered medications on file prior to visit.    Review of Systems     Objective:   Physical Exam        Assessment & Plan:   Problem List Items Addressed This Visit       Cardiovascular and Mediastinum   Essential hypertension - Primary     Musculoskeletal and Integument   Osteoporosis     Other   Routine general medical examination at a health care facility   Hyperbilirubinemia   Colon cancer screening

## 2023-05-24 NOTE — Assessment & Plan Note (Signed)
Reviewed health habits including diet and exercise and skin cancer prevention Reviewed appropriate screening tests for age  Also reviewed health mt list, fam hx and immunization status , as well as social and family history   See HPI Labs reviewed and orderedhad colonoscopy 2021  Mammo and dexam utd  Utd derm care Declines hearing evalPHQ 0 in sept  Health Maintenance  Topic Date Due   DTaP/Tdap/Td vaccine (3 - Tdap) 05/21/2024*   COVID-19 Vaccine (6 - 2023-24 season) 06/06/2025*   Mammogram  07/31/2023   Medicare Annual Wellness Visit  05/22/2024   Pneumonia Vaccine  Completed   Flu Shot  Completed   DEXA scan (bone density measurement)  Completed   Hepatitis C Screening  Completed   Zoster (Shingles) Vaccine  Completed   HPV Vaccine  Aged Out   Colon Cancer Screening  Discontinued  *Topic was postponed. The date shown is not the original due date.

## 2023-05-24 NOTE — Assessment & Plan Note (Signed)
Still bothersome  Considering visit with sport med  May need further eval and possibly injection and /or PT if this is what causes her pain

## 2023-05-24 NOTE — Assessment & Plan Note (Signed)
bp in fair control at this time  BP Readings from Last 1 Encounters:  05/23/23 118/74   No changes needed Most recent labs reviewed  Disc lifstyle change with low sodium diet and exercise  Plan to continue lisinopril hct 10-12.5 mg daily

## 2023-05-24 NOTE — Assessment & Plan Note (Signed)
Bilateral  Normal exam Pt is not ready for audiology eval yet but will let us know

## 2023-05-24 NOTE — Assessment & Plan Note (Signed)
Worsened by stress or anxiety  Intermittent with cramping Refilled levsin SL which helps symptoms Colonoscopy 2021

## 2023-05-24 NOTE — Assessment & Plan Note (Signed)
.   Lab Results  Component Value Date   ALT 21 05/16/2023   AST 23 05/16/2023   ALKPHOS 50 05/16/2023   BILITOT 1.6 (H) 05/16/2023   Fairly stable  Suspect Gilbert's  No clinical changes

## 2023-05-24 NOTE — Assessment & Plan Note (Signed)
Had colonoscopy 2021

## 2023-05-24 NOTE — Assessment & Plan Note (Signed)
Dexa 07/2022 No falls or fractures  Discussed fall prevention, supplements and exercise for bone density  D level is in normal range

## 2023-06-11 ENCOUNTER — Other Ambulatory Visit (HOSPITAL_COMMUNITY): Payer: Self-pay

## 2023-06-11 NOTE — Telephone Encounter (Signed)
Medication is not covered under Part D law.

## 2023-06-11 NOTE — Telephone Encounter (Signed)
Please let pt know that this is no longer covered by her insurance She can ask pharmacist how much it is out of pocket   Please find out from pharmacy also if there is a preferred/covered similar drug we can try

## 2023-06-11 NOTE — Telephone Encounter (Signed)
See PA dpt's response

## 2023-06-12 NOTE — Telephone Encounter (Signed)
Spoke with Marylene Land of [facility] asking about preferred similar drug. She states there isn't one available. However, pt used a discount card and paid $36 for #180 tabs.

## 2023-07-07 ENCOUNTER — Other Ambulatory Visit: Payer: Self-pay | Admitting: Family Medicine

## 2023-07-07 DIAGNOSIS — Z1231 Encounter for screening mammogram for malignant neoplasm of breast: Secondary | ICD-10-CM

## 2023-07-14 ENCOUNTER — Encounter: Payer: Self-pay | Admitting: Family Medicine

## 2023-07-14 DIAGNOSIS — H9193 Unspecified hearing loss, bilateral: Secondary | ICD-10-CM

## 2023-07-16 NOTE — Telephone Encounter (Signed)
 Please let her know I put the referral in for audiology eval Please let us know if you don't hear in 1-2 weeks

## 2023-08-01 ENCOUNTER — Ambulatory Visit
Admission: RE | Admit: 2023-08-01 | Discharge: 2023-08-01 | Disposition: A | Discharge status: Home / Self Care | Payer: PPO | Source: Ambulatory Visit | Attending: Family Medicine | Admitting: Family Medicine

## 2023-08-01 DIAGNOSIS — Z1231 Encounter for screening mammogram for malignant neoplasm of breast: Secondary | ICD-10-CM | POA: Insufficient documentation

## 2023-08-04 ENCOUNTER — Encounter: Payer: Self-pay | Admitting: Family Medicine

## 2023-09-04 IMAGING — MG MM DIGITAL SCREENING BILAT W/ TOMO AND CAD
6 of 10 series · 6 of 30 positions shown · non-contrast
Comparison: Previous exam(s).

CLINICAL DATA: Screening.

EXAM:
DIGITAL SCREENING BILATERAL MAMMOGRAM WITH TOMOSYNTHESIS AND CAD
TECHNIQUE: Bilateral screening digital craniocaudal and mediolateral oblique
mammograms were obtained. Bilateral screening digital breast
tomosynthesis was performed. The images were evaluated with
computer-aided detection.

[L CC synth-2D]
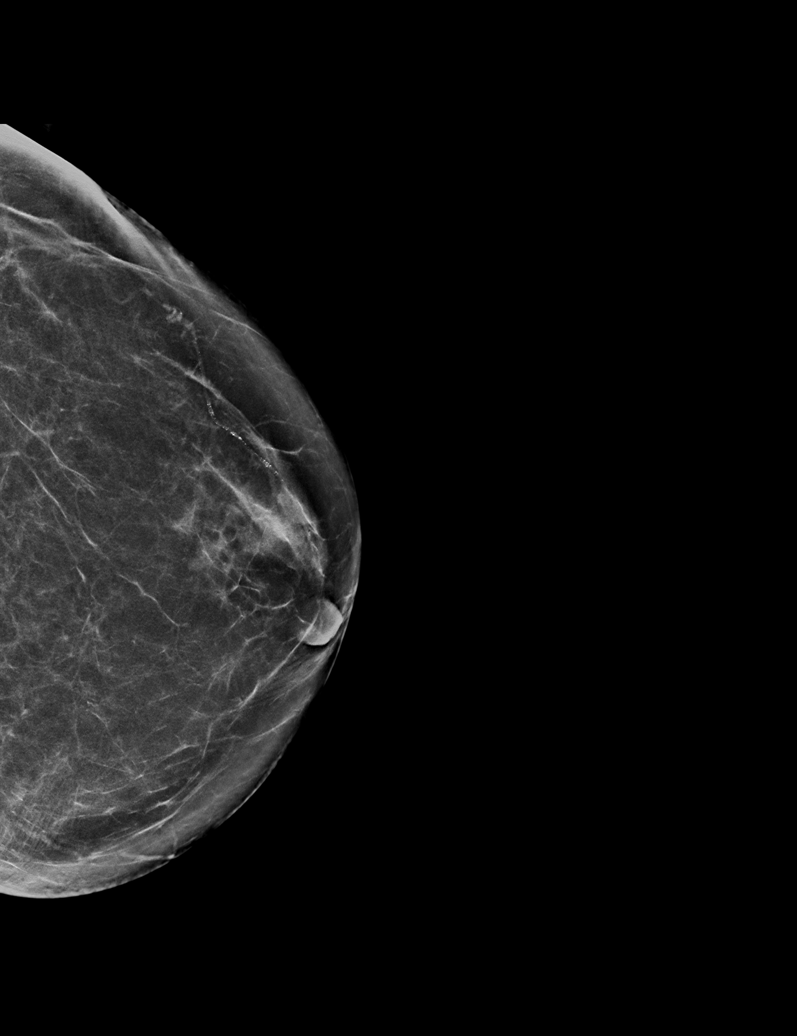

[R CC synth-2D]
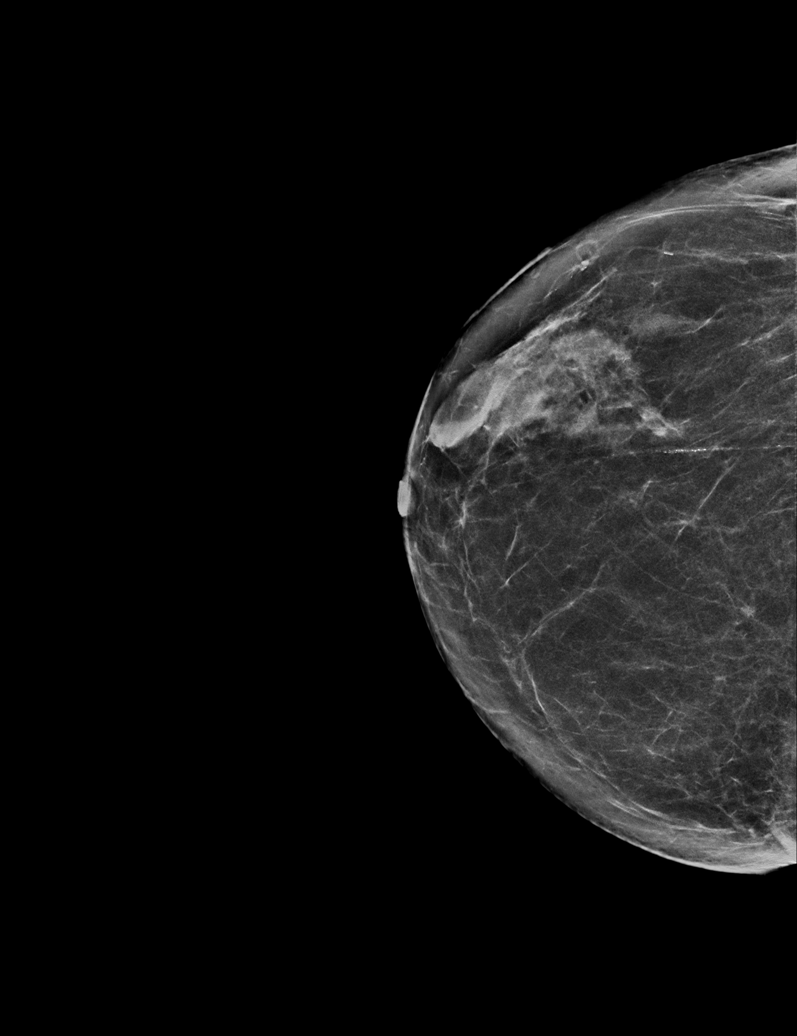

[L MLO synth-2D]
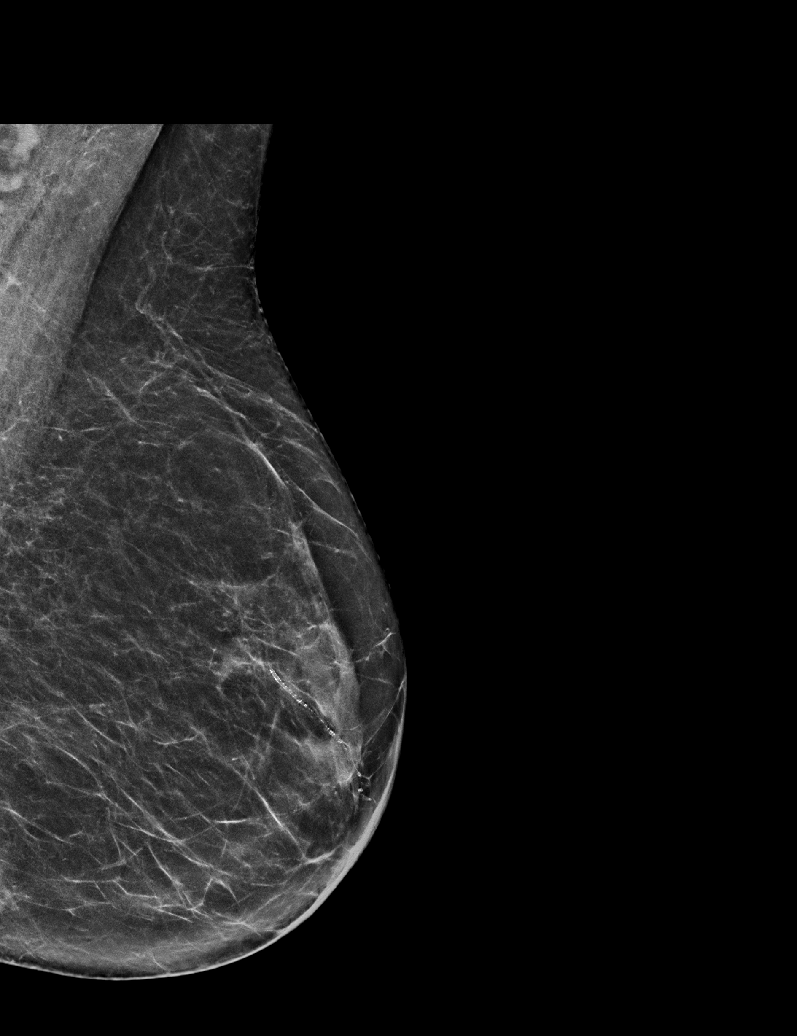

[R MLO synth-2D (1 of 2)]
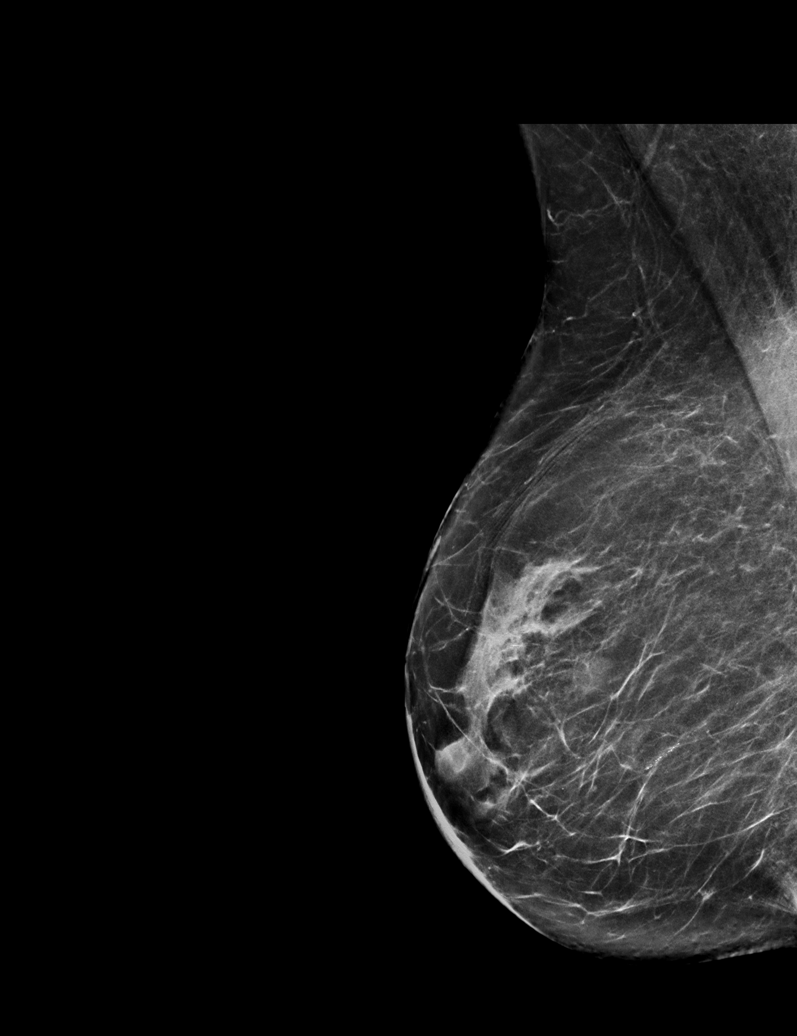

[R MLO synth-2D (2 of 2)]
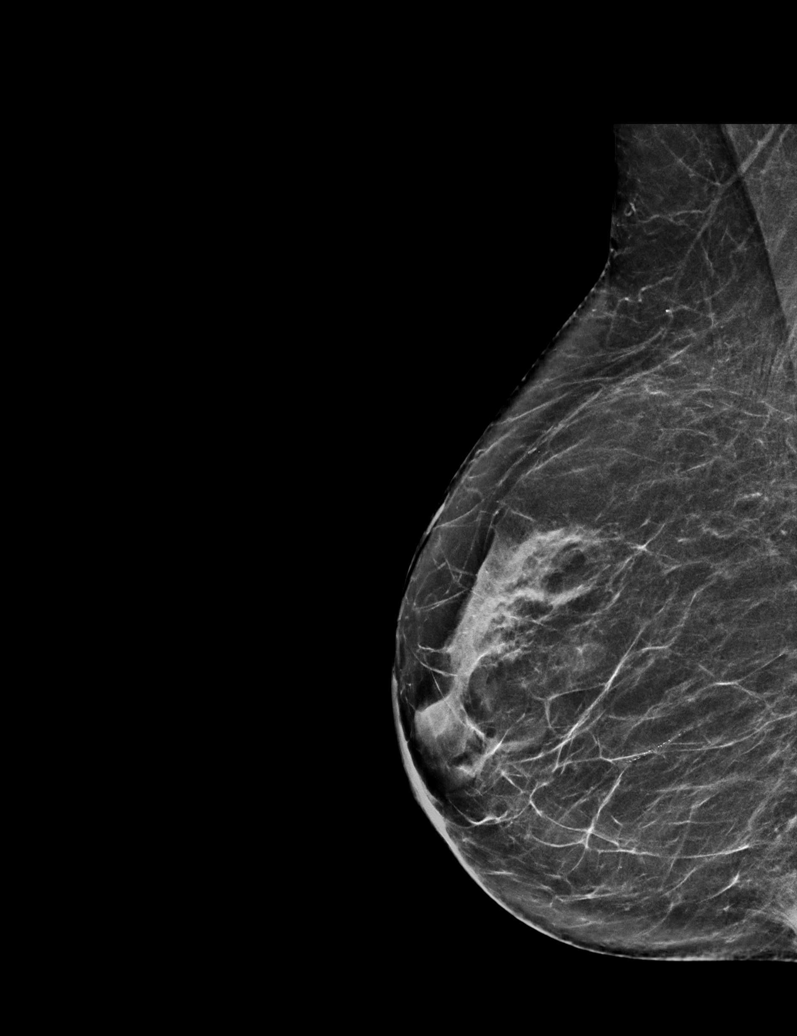

[L MLO tomo · tomo slice 29/58.0]
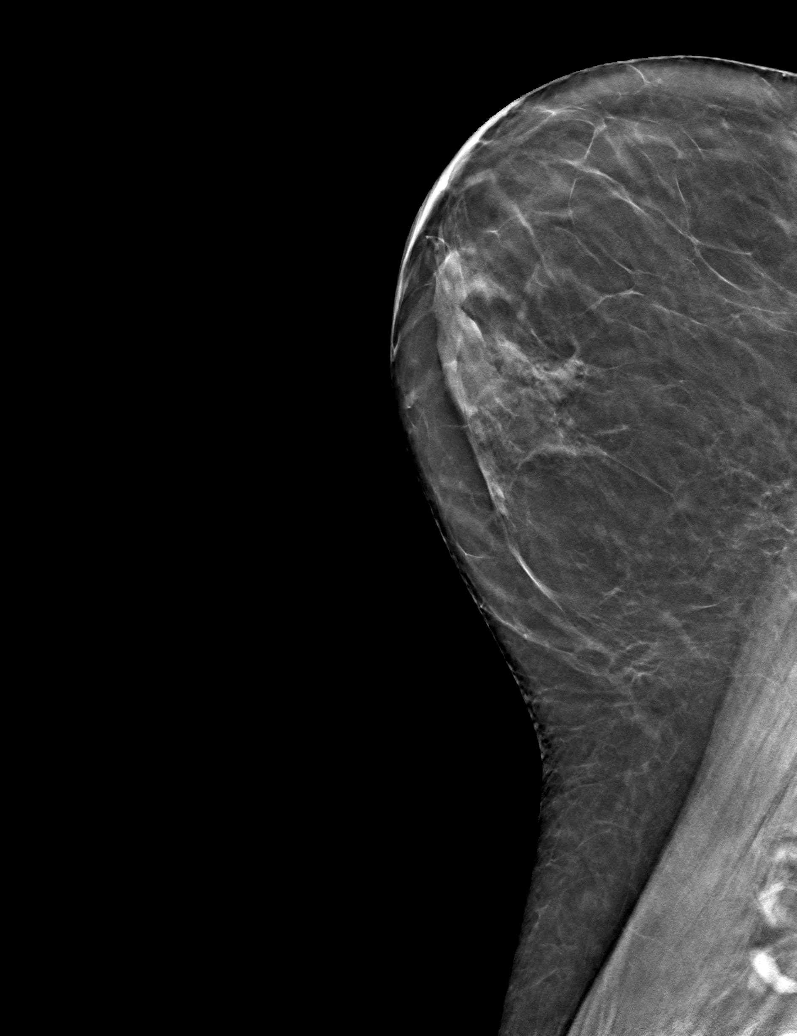

[6 of 30 positions shown; findings below may reference images not displayed]

ACR Breast Density Category b: There are scattered areas of
fibroglandular density.
FINDINGS: There are no findings suspicious for malignancy.
IMPRESSION: No mammographic evidence of malignancy. A result letter of this
screening mammogram will be mailed directly to the patient.

RECOMMENDATION:
Screening mammogram in one year. (Code:51-O-LD2)

BI-RADS CATEGORY  1: Negative.

## 2023-11-16 NOTE — Progress Notes (Unsigned)
     Sherran Margolis T. Clayson Riling, MD, CAQ Sports Medicine [facility] [address] [place], [address]  Phone: [phone]  FAX: [phone]  ALEXIE MANGRUM - 78 y.o. female  MRN [id number]  Date of Birth: 11/01/1945  Date: 11/17/2023  PCP: Clemens Curt, MD  Referral: Clemens Curt, MD  No chief complaint on file.  Subjective:   ZAIDYN BARTUNEK is a 78 y.o. very pleasant female patient with There is no height or weight on file to calculate BMI. who presents with the following:  Joya is a very pleasant 78 year old lady, she presents with some ongoing hip pain.    Review of Systems is noted in the HPI, as appropriate  Objective:   There were no vitals taken for this visit.  GEN: No acute distress; alert,appropriate. PULM: Breathing comfortably in no respiratory distress PSYCH: Normally interactive.   Laboratory and Imaging Data:  Assessment and Plan:   ***

## 2023-11-17 ENCOUNTER — Ambulatory Visit (INDEPENDENT_AMBULATORY_CARE_PROVIDER_SITE_OTHER)
Admission: RE | Admit: 2023-11-17 | Discharge: 2023-11-17 | Disposition: A | Discharge status: Home / Self Care | Source: Ambulatory Visit | Attending: Family Medicine | Admitting: Family Medicine

## 2023-11-17 ENCOUNTER — Encounter: Payer: Self-pay | Admitting: Family Medicine

## 2023-11-17 ENCOUNTER — Ambulatory Visit (INDEPENDENT_AMBULATORY_CARE_PROVIDER_SITE_OTHER): Admitting: Family Medicine

## 2023-11-17 VITALS — BP 138/70 | HR 69 | Temp 98.0°F | Ht 61.25 in | Wt 125.0 lb

## 2023-11-17 DIAGNOSIS — M7062 Trochanteric bursitis, left hip: Secondary | ICD-10-CM

## 2023-11-17 DIAGNOSIS — M25552 Pain in left hip: Secondary | ICD-10-CM | POA: Diagnosis not present

## 2023-11-17 DIAGNOSIS — M1612 Unilateral primary osteoarthritis, left hip: Secondary | ICD-10-CM | POA: Diagnosis not present

## 2023-11-17 MED ORDER — TRIAMCINOLONE ACETONIDE 40 MG/ML IJ SUSP
40.0000 mg | Freq: Once | INTRAMUSCULAR | Status: AC
  Administered 2023-11-17: 40 mg via INTRA_ARTICULAR

## 2023-11-17 NOTE — Patient Instructions (Signed)
Hip Rehab:  Hip Flexion: Toe up to ceiling, laying on your back. Lift your whole leg, 3 sets. Work up to being able to do #30 with each set.  Hip elevations, Toe and leg turned out to side.  Lift whole leg, 3 sets. Work up to being able to do #30 with each set.  Hip Abductions: Lying on side, straight out to side. 3 sets, work up to being able to do #30 with each set.  At the beginning you may only be able to do a lot less, try to do #10.

## 2024-03-15 ENCOUNTER — Other Ambulatory Visit: Payer: Self-pay

## 2024-03-15 ENCOUNTER — Telehealth: Payer: Self-pay | Admitting: Family Medicine

## 2024-03-15 DIAGNOSIS — Z23 Encounter for immunization: Secondary | ICD-10-CM

## 2024-03-15 MED ORDER — COVID-19 MRNA VAC-TRIS(PFIZER) 30 MCG/0.3ML IM SUSY: 0.3000 mL | PREFILLED_SYRINGE | Freq: Once | INTRAMUSCULAR | 0 refills | Status: AC

## 2024-03-15 NOTE — Telephone Encounter (Signed)
 Patient spouse notified that vaccine rx has been sent

## 2024-03-15 NOTE — Telephone Encounter (Signed)
 [facility] to get [facility] shot and the [facility] staff say she would need a prescription from the pt's doctor first in order to have her booster shot.

## 2024-03-18 ENCOUNTER — Ambulatory Visit: Payer: PPO | Admitting: Dermatology

## 2024-04-06 ENCOUNTER — Ambulatory Visit: Admitting: Dermatology

## 2024-04-06 DIAGNOSIS — D1801 Hemangioma of skin and subcutaneous tissue: Secondary | ICD-10-CM | POA: Diagnosis not present

## 2024-04-06 DIAGNOSIS — L814 Other melanin hyperpigmentation: Secondary | ICD-10-CM

## 2024-04-06 DIAGNOSIS — D2361 Other benign neoplasm of skin of right upper limb, including shoulder: Secondary | ICD-10-CM | POA: Diagnosis not present

## 2024-04-06 DIAGNOSIS — W908XXA Exposure to other nonionizing radiation, initial encounter: Secondary | ICD-10-CM

## 2024-04-06 DIAGNOSIS — Z85828 Personal history of other malignant neoplasm of skin: Secondary | ICD-10-CM | POA: Diagnosis not present

## 2024-04-06 DIAGNOSIS — Z1283 Encounter for screening for malignant neoplasm of skin: Secondary | ICD-10-CM | POA: Diagnosis not present

## 2024-04-06 DIAGNOSIS — Z79899 Other long term (current) drug therapy: Secondary | ICD-10-CM | POA: Diagnosis not present

## 2024-04-06 DIAGNOSIS — D229 Melanocytic nevi, unspecified: Secondary | ICD-10-CM

## 2024-04-06 DIAGNOSIS — L821 Other seborrheic keratosis: Secondary | ICD-10-CM

## 2024-04-06 DIAGNOSIS — L578 Other skin changes due to chronic exposure to nonionizing radiation: Secondary | ICD-10-CM

## 2024-04-06 DIAGNOSIS — L820 Inflamed seborrheic keratosis: Secondary | ICD-10-CM | POA: Diagnosis not present

## 2024-04-06 DIAGNOSIS — L719 Rosacea, unspecified: Secondary | ICD-10-CM | POA: Diagnosis not present

## 2024-04-06 MED ORDER — METRONIDAZOLE 0.75 % EX CREA: TOPICAL_CREAM | CUTANEOUS | 3 refills | Status: AC

## 2024-04-06 MED ORDER — METRONIDAZOLE 0.75 % EX GEL: 1.0000 | Freq: Every day | CUTANEOUS | 3 refills | Status: AC

## 2024-04-06 NOTE — Progress Notes (Unsigned)
 Follow-Up Visit   Subjective  Marie Kennedy is a 78 y.o. female who presents for the following: Skin Cancer Screening and Full Body Skin Exam hx of BCCs, Rosacea face, Metronidazole  gel and cr bid  The patient presents for Total-Body Skin Exam (TBSE) for skin cancer screening and mole check. The patient has spots, moles and lesions to be evaluated, some may be new or changing and the patient may have concern these could be cancer.  The following portions of the chart were reviewed this encounter and updated as appropriate: medications, allergies, medical history  Review of Systems:  No other skin or systemic complaints except as noted in HPI or Assessment and Plan.  Objective  Well appearing patient in no apparent distress; mood and affect are within normal limits.  A full examination was performed including scalp, head, eyes, ears, nose, lips, neck, chest, axillae, abdomen, back, buttocks, bilateral upper extremities, bilateral lower extremities, hands, feet, fingers, toes, fingernails, and toenails. All findings within normal limits unless otherwise noted below.   Relevant physical exam findings are noted in the Assessment and Plan.  L cheek x 2 (2) Stuck on waxy paps with erythema  Assessment & Plan   SKIN CANCER SCREENING PERFORMED TODAY.  ACTINIC DAMAGE - Chronic condition, secondary to cumulative UV/sun exposure - diffuse scaly erythematous macules with underlying dyspigmentation - Recommend daily broad spectrum sunscreen SPF 30+ to sun-exposed areas, reapply every 2 hours as needed.  - Staying in the shade or wearing long sleeves, sun glasses (UVA+UVB protection) and wide brim hats (4-inch brim around the entire circumference of the hat) are also recommended for sun protection.  - Call for new or changing lesions.  LENTIGINES, SEBORRHEIC KERATOSES, HEMANGIOMAS - Benign normal skin lesions - Benign-appearing - Call for any changes  MELANOCYTIC NEVI - Tan-brown and/or  pink-flesh-colored symmetric macules and papules - Benign appearing on exam today - Observation - Call clinic for new or changing moles - Recommend daily use of broad spectrum spf 30+ sunscreen to sun-exposed areas.   HISTORY OF BASAL CELL CARCINOMA OF THE SKIN - No evidence of recurrence today - Recommend regular full body skin exams - Recommend daily broad spectrum sunscreen SPF 30+ to sun-exposed areas, reapply every 2 hours as needed.  - Call if any new or changing lesions are noted between office visits  - R forehead, R sup forehead, R ala of nose, L lat proximal elbow  ROSACEA face Exam Mid face erythema with telangiectasias  Chronic condition with duration or expected duration over one year. Currently well-controlled. Rosacea is a chronic progressive skin condition usually affecting the face of adults, causing redness and/or acne bumps. It is treatable but not curable. It sometimes affects the eyes (ocular rosacea) as well. It may respond to topical and/or systemic medication and can flare with stress, sun exposure, alcohol, exercise, topical steroids (including hydrocortisone/cortisone 10) and some foods.  Daily application of broad spectrum spf 30+ sunscreen to face is recommended to reduce flares. Treatment Plan Cont Metronidazole  0.75% gel qam to face Cont Metronidazole  0.75% cr qhs Long term medication management.  Patient is using long term (months to years) prescription medication  to control their dermatologic condition.  These medications require periodic monitoring to evaluate for efficacy and side effects and may require periodic laboratory monitoring.  BLUE NEVUS R lat bicep Exam: blue gray macule R lat bicep Treatment Plan: Long hx without change Benign-appearing.  Observation.  Call clinic for new or changing moles.  Recommend daily  use of broad spectrum spf 30+ sunscreen to sun-exposed areas.   INFLAMED SEBORRHEIC KERATOSIS (2) L cheek x 2 (2) Symptomatic,  irritating, patient would like treated. Destruction of lesion - L cheek x 2 (2) Complexity: simple   Destruction method: cryotherapy   Informed consent: discussed and consent obtained   Timeout:  patient name, date of birth, surgical site, and procedure verified Lesion destroyed using liquid nitrogen: Yes   Region frozen until ice ball extended beyond lesion: Yes   Outcome: patient tolerated procedure well with no complications   Post-procedure details: wound care instructions given    Return in about 1 year (around 04/06/2025) for TBSE, Hx of BCC.  I, Grayce Saunas, RMA, am acting as scribe for Alm Rhyme, MD .   Documentation: I have reviewed the above documentation for accuracy and completeness, and I agree with the above.  Alm Rhyme, MD

## 2024-04-06 NOTE — Patient Instructions (Addendum)

## 2024-04-07 ENCOUNTER — Encounter: Payer: Self-pay | Admitting: Dermatology

## 2024-04-28 ENCOUNTER — Telehealth: Payer: Self-pay | Admitting: *Deleted

## 2024-04-28 NOTE — Telephone Encounter (Signed)
 Please schedule fasting lab appt prior to CPE. PCP will place orders closer to appt date

## 2024-04-28 NOTE — Telephone Encounter (Signed)
 Copied from CRM [id number]. Topic: Clinical - Request for Lab/Test Order >> Apr 28, 2024 11:29 AM Marie Kennedy wrote: Reason for CRM: Patient scheduled labs for physical 05/19/24 and needs lab orders put in

## 2024-04-28 NOTE — Telephone Encounter (Signed)
 fasting lab schedule

## 2024-04-30 ENCOUNTER — Ambulatory Visit

## 2024-04-30 VITALS — Ht 61.0 in | Wt 125.0 lb

## 2024-04-30 DIAGNOSIS — Z0000 Encounter for general adult medical examination without abnormal findings: Secondary | ICD-10-CM

## 2024-04-30 NOTE — Progress Notes (Signed)
 Subjective:  Please attest and cosign this visit due to patients primary care provider not being in the office at the time the visit was completed.  (Pt of Dr Laine Balls)   Marie Kennedy is a 78 y.o. who presents for a Medicare Wellness preventive visit.  As a reminder, Annual Wellness Visits don't include a physical exam, and some assessments may be limited, especially if this visit is performed virtually. We may recommend an in-person follow-up visit with your provider if needed.  Visit Complete: Virtual I connected with  Marie Kennedy on 04/30/24 by a audio enabled telemedicine application and verified that I am speaking with the correct person using two identifiers.  Patient Location: Home  Provider Location: Office/Clinic  I discussed the limitations of evaluation and management by telemedicine. The patient expressed understanding and agreed to proceed.  Vital Signs: Because this visit was a virtual/telehealth visit, some criteria may be missing or patient reported. Any vitals not documented were not able to be obtained and vitals that have been documented are patient reported.  VideoDeclined- This patient declined Librarian, academic. Therefore the visit was completed with audio only.  Persons Participating in Visit: Patient.  AWV Questionnaire: No: Patient Medicare AWV questionnaire was not completed prior to this visit.  Cardiac Risk Factors include: advanced age (>66men, >20 women);hypertension     Objective:    Today's Vitals   04/30/24 1306  Weight: 125 lb (56.7 kg)  Height: 5' 1 (1.549 m)   Body mass index is 23.62 kg/m.     04/30/2024    1:05 PM 03/17/2023    2:37 PM 05/07/2022   12:03 PM 12/30/2018    8:32 AM 12/10/2017    9:00 AM 06/18/2017    8:44 AM 05/19/2017    9:13 AM  Advanced Directives  Does Patient Have a Medical Advance Directive? No No No No No  No  No   Would patient like information on creating a medical advance  directive? No - Patient declined No - Patient declined No - Patient declined No - Patient declined  No - Patient declined  No - Patient declined  No - Patient declined      Data saved with a previous flowsheet row definition    Current Medications (verified) Outpatient Encounter Medications as of 04/30/2024  Medication Sig   Calcium Carbonate-Vit D-Min 600-400 MG-UNIT TABS Take 1 tablet by mouth daily.   clotrimazole -betamethasone  (LOTRISONE ) cream APPLY TWICE A DAY TO AFFECTED FINGERS AS NEEDED   DiphenhydrAMINE HCl (ALLERGY MED PO) Take 25 mg by mouth daily as needed. Allergies    hyoscyamine  (LEVSIN  SL) 0.125 MG SL tablet PLACE 1 TABLET UNDER THE TONGUE 2 TIMES DAILY AS NEEDED FOR ABDOMINAL CRAMPING   lisinopril -hydrochlorothiazide  (ZESTORETIC ) 10-12.5 MG tablet Take 0.5 tablets by mouth daily.   metroNIDAZOLE  (METROCREAM ) 0.75 % cream At bedtime to face for Rosacea   metroNIDAZOLE  (METROGEL ) 0.75 % gel Apply 1 Application topically daily. Qam to face for Rosacea   No facility-administered encounter medications on file as of 04/30/2024.    Allergies (verified) Patient has no known allergies.   History: Past Medical History:  Diagnosis Date   Allergy    Basal cell adenoma 11/14   face   Basal cell carcinoma 03/11/2013   Right forehead. Nodular pattern. Excised: 05/14/2016   Cancer (HCC)    skin   Fibroids 1993   cysts endometriosis   GERD (gastroesophageal reflux disease)    History of basal cell  carcinoma (BCC) 05/14/2016   right superior forehead, excision   Hx of basal cell carcinoma 03/11/2013   Right ala of nose. BCC with sclerosis   Hx of basal cell carcinoma 01/05/2015   Left lateral proximal elbow. Superficial.   Hypertension    PCP   Dr Randeen at [facility]   IBS (irritable bowel syndrome)    Rosacea    Past Surgical History:  Procedure Laterality Date   ABDOMINAL HYSTERECTOMY  1993   BASAL CELL CARCINOMA EXCISION  11/14   face   BREAST  EXCISIONAL BIOPSY Right 1970   Negative   BREAST SURGERY Right 1970's   lumpectomy no cancer   CATARACT EXTRACTION W/PHACO Left 05/19/2017   Procedure: CATARACT EXTRACTION PHACO AND INTRAOCULAR LENS PLACEMENT (IOC) LEFT;  Surgeon: Mittie Gaskin, MD;  Location: [facility];  Service: Ophthalmology;  Laterality: Left;   CATARACT EXTRACTION W/PHACO Right 06/18/2017   Procedure: CATARACT EXTRACTION PHACO AND INTRAOCULAR LENS PLACEMENT (IOC)  COMPLICATED  RIGHT;  Surgeon: Mittie Gaskin, MD;  Location: [facility];  Service: Ophthalmology;  Laterality: Right;   CHOLECYSTECTOMY  06/18/2011   Procedure: LAPAROSCOPIC CHOLECYSTECTOMY WITH INTRAOPERATIVE CHOLANGIOGRAM;  Surgeon: Debby LABOR. Cornett, MD;  Location: [facility];  Service: General;  Laterality: N/A;  lap chole with IOC   OOPHORECTOMY     TONSILLECTOMY  1953   Family History  Problem Relation Age of Onset   Hypertension Mother    Cancer Mother        breast, uterine, and cervical   Breast cancer Mother 83   Heart disease Father        CAD   Hypertension Father    Diabetes Father        TYPE II   Cancer Father        brain   Colon cancer Neg Hx    Esophageal cancer Neg Hx    Rectal cancer Neg Hx    Stomach cancer Neg Hx    Social History   Socioeconomic History   Marital status: Married    Spouse name: Not on file   Number of children: Not on file   Years of education: Not on file   Highest education level: Not on file  Occupational History   Not on file  Tobacco Use   Smoking status: Never   Smokeless tobacco: Never  Vaping Use   Vaping status: Never Used  Substance and Sexual Activity   Alcohol use: No    Alcohol/week: 0.0 standard drinks of alcohol   Drug use: No   Sexual activity: Yes  Other Topics Concern   Not on file  Social History Narrative   Married    Social Drivers of Health   Financial Resource Strain: Low Risk  (04/30/2024)   Overall Financial Resource Strain (CARDIA)     Difficulty of Paying Living Expenses: Not hard at all  Food Insecurity: No Food Insecurity (04/30/2024)   Hunger Vital Sign    Worried About Running Out of Food in the Last Year: Never true    Ran Out of Food in the Last Year: Never true  Transportation Needs: No Transportation Needs (04/30/2024)   PRAPARE - Administrator, Civil Service (Medical): No    Lack of Transportation (Non-Medical): No  Physical Activity: Sufficiently Active (04/30/2024)   Exercise Vital Sign    Days of Exercise per Week: 5 days    Minutes of Exercise per Session: 60 min  Stress: No Stress Concern Present (  04/30/2024)   [employer] of Occupational Health - Occupational Stress Questionnaire    Feeling of Stress: Not at all  Social Connections: Socially Isolated (04/30/2024)   Social Connection and Isolation Panel    Frequency of Communication with Friends and Family: Once a week    Frequency of Social Gatherings with Friends and Family: Once a week    Attends Religious Services: Never    Database administrator or Organizations: No    Attends Engineer, structural: Never    Marital Status: Married    Tobacco Counseling Counseling given: No    Clinical Intake:  Pre-visit preparation completed: Yes  Pain : No/denies pain     BMI - recorded: 23.62 Nutritional Status: BMI of 19-24  Normal Nutritional Risks: None Diabetes: No  No results found for: HGBA1C   How often do you need to have someone help you when you read instructions, pamphlets, or other written materials from your doctor or pharmacy?: 1 - Never  Interpreter Needed?: No  Information entered by :: Verdie Saba, CMA   Activities of Daily Living     04/30/2024    1:09 PM  In your present state of health, do you have any difficulty performing the following activities:  Hearing? 0  Vision? 0  Difficulty concentrating or making decisions? 0  Walking or climbing stairs? 0  Dressing or bathing? 0   Doing errands, shopping? 0  Preparing Food and eating ? N  Using the Toilet? N  In the past six months, have you accidently leaked urine? N  Do you have problems with loss of bowel control? N  Managing your Medications? N  Managing your Finances? N  Housekeeping or managing your Housekeeping? N    Patient Care Team: Tower, Laine LABOR, MD as PCP - General (Family Medicine) Hester Alm BROCKS, MD as Referring Physician (Dermatology) Burnie Donzell Hollow, MD as Referring Physician (Ophthalmology) Dwayne Bohr, DDS as Referring Physician (Dentistry)  I have updated your Care Teams any recent Medical Services you may have received from other providers in the past year.     Assessment:   This is a routine wellness examination for Marie Kennedy.  Hearing/Vision screen Hearing Screening - Comments:: Denies hearing difficulties   Vision Screening - Comments:: Wears rx glasses - up to date with routine eye exams with [facility]   Goals Addressed               This Visit's Progress     Patient Stated (pt-stated)        Patient stated she plans to continue walk daily       Depression Screen     04/30/2024    1:10 PM 11/17/2023    3:16 PM 03/17/2023    2:35 PM 05/07/2022   12:10 PM 05/01/2021   11:02 AM 12/30/2018    8:32 AM 12/10/2017    8:44 AM  PHQ 2/9 Scores  PHQ - 2 Score 0 0 0 0 0 0 0  PHQ- 9 Score 0   0  0 0    Fall Risk     04/30/2024    1:09 PM 11/17/2023    3:16 PM 03/17/2023    2:37 PM 05/07/2022   12:04 PM 05/01/2021   11:01 AM  Fall Risk   Falls in the past year? 0 0 0 0 0  Number falls in past yr: 0 0 0 0 0  Injury with Fall? 0 0 0 0 0  Risk  for fall due to : No Fall Risks No Fall Risks Impaired vision  No Fall Risks  Follow up Falls evaluation completed;Falls prevention discussed Falls evaluation completed Falls prevention discussed Falls evaluation completed;Education provided;Falls prevention discussed  Falls evaluation completed      Data saved  with a previous flowsheet row definition    MEDICARE RISK AT HOME:  Medicare Risk at Home Any stairs in or around the home?: No If so, are there any without handrails?: No Home free of loose throw rugs in walkways, pet beds, electrical cords, etc?: Yes Adequate lighting in your home to reduce risk of falls?: Yes Life alert?: No Use of a cane, walker or w/c?: No Grab bars in the bathroom?: No Shower chair or bench in shower?: Yes Elevated toilet seat or a handicapped toilet?: Yes  TIMED UP AND GO:  Was the test performed?  No  Cognitive Function: 6CIT completed    12/10/2017    8:59 AM 12/04/2016    8:35 AM 11/29/2015    2:22 PM  MMSE - Mini Mental State Exam  Orientation to time 5 5  5    Orientation to Place 5 5  5    Registration 3 3  3    Attention/ Calculation 0 0  0   Recall 3 3  3    Language- name 2 objects 0 0  0   Language- repeat 1 1 1   Language- follow 3 step command 3 3  3    Language- read & follow direction 0 0  0   Write a sentence 0 0  0   Copy design 0 0  0   Total score 20 20  20       Data saved with a previous flowsheet row definition        04/30/2024    1:11 PM 03/17/2023    2:38 PM 05/07/2022   12:06 PM 05/01/2021   11:02 AM  6CIT Screen  What Year? 0 points 0 points 0 points 0 points  What month? 0 points 0 points 0 points 0 points  What time? 0 points 0 points 0 points 0 points  Count back from 20 0 points 0 points 0 points 0 points  Months in reverse 0 points 0 points 0 points 0 points  Repeat phrase 0 points 0 points 0 points 0 points  Total Score 0 points 0 points 0 points 0 points    Immunizations Immunization History  Administered Date(s) Administered   Fluad Quad(high Dose 65+) 03/09/2019, 04/22/2020, 04/16/2022   Fluad Trivalent(High Dose 65+) 04/29/2023   INFLUENZA, HIGH DOSE SEASONAL PF 04/09/2021   Influenza Whole 04/30/2006   Influenza,inj,Quad PF,6+ Mos 04/22/2014, 05/23/2015, 04/15/2016, 05/01/2017, 04/02/2018   PFIZER(Purple  Top)SARS-COV-2 Vaccination 08/13/2019, 09/03/2019   Pfizer Covid-19 Vaccine Bivalent Booster 45yrs & up 03/16/2021, 10/29/2021   Pfizer(Comirnaty)Fall Seasonal Vaccine 12 years and older 05/01/2022   Pneumococcal Conjugate-13 11/16/2014   Pneumococcal Polysaccharide-23 12/12/2010   Respiratory Syncytial Virus Vaccine,Recomb Aduvanted(Arexvy) 05/15/2022   Td 07/20/2002, 06/22/2012   Zoster Recombinant(Shingrix) 11/29/2019, 02/04/2020   Zoster, Live 07/03/2012    Screening Tests Health Maintenance  Topic Date Due   Influenza Vaccine  02/06/2024   COVID-19 Vaccine (6 - 2025-26 season) 03/08/2024   DTaP/Tdap/Td (3 - Tdap) 05/21/2024 (Originally 06/22/2022)   Mammogram  07/31/2024   Medicare Annual Wellness (AWV)  04/30/2025   Pneumococcal Vaccine: 50+ Years  Completed   DEXA SCAN  Completed   Hepatitis C Screening  Completed   Zoster Vaccines- Shingrix  Completed   Meningococcal B Vaccine  Aged Out   Colonoscopy  Discontinued    Health Maintenance Items Addressed:  04/30/2024  Additional Screening:  Vision Screening: Recommended annual ophthalmology exams for early detection of glaucoma and other disorders of the eye. Is the patient up to date with their annual eye exam?  Yes  Who is the provider or what is the name of the office in which the patient attends annual eye exams? [facility]  Dental Screening: Recommended annual dental exams for proper oral hygiene  Community Resource Referral / Chronic Care Management: CRR required this visit?  No   CCM required this visit?  No   Plan:    I have personally reviewed and noted the following in the patient's chart:   Medical and social history Use of alcohol, tobacco or illicit drugs  Current medications and supplements including opioid prescriptions. Patient is not currently taking opioid prescriptions. Functional ability and status Nutritional status Physical activity Advanced directives List of other  physicians Hospitalizations, surgeries, and ER visits in previous 12 months Vitals Screenings to include cognitive, depression, and falls Referrals and appointments  In addition, I have reviewed and discussed with patient certain preventive protocols, quality metrics, and best practice recommendations. A written personalized care plan for preventive services as well as general preventive health recommendations were provided to patient.   Verdie CHRISTELLA Saba, CMA   04/30/2024   After Visit Summary: (MyChart) Due to this being a telephonic visit, the after visit summary with patients personalized plan was offered to patient via MyChart   Notes: Nothing significant to report at this time.

## 2024-04-30 NOTE — Patient Instructions (Signed)
 Ms. Marovich,  Thank you for taking the time for your Medicare Wellness Visit. I appreciate your continued commitment to your health goals. Please review the care plan we discussed, and feel free to reach out if I can assist you further.  Medicare recommends these wellness visits once per year to help you and your care team stay ahead of potential health issues. These visits are designed to focus on prevention, allowing your provider to concentrate on managing your acute and chronic conditions during your regular appointments.  Please note that Annual Wellness Visits do not include a physical exam. Some assessments may be limited, especially if the visit was conducted virtually. If needed, we may recommend a separate in-person follow-up with your provider.  Ongoing Care Seeing your primary care provider every 3 to 6 months helps us  monitor your health and provide consistent, personalized care.  Referrals If a referral was made during today's visit and you haven't received any updates within two weeks, please contact the referred provider directly to check on the status.  Recommended Screenings:  Health Maintenance  Topic Date Due   Flu Shot  02/06/2024   COVID-19 Vaccine (6 - 2025-26 season) 03/08/2024   DTaP/Tdap/Td vaccine (3 - Tdap) 05/21/2024*   Breast Cancer Screening  07/31/2024   Medicare Annual Wellness Visit  04/30/2025   Pneumococcal Vaccine for age over 23  Completed   DEXA scan (bone density measurement)  Completed   Hepatitis C Screening  Completed   Zoster (Shingles) Vaccine  Completed   Meningitis B Vaccine  Aged Out   Colon Cancer Screening  Discontinued  *Topic was postponed. The date shown is not the original due date.       04/30/2024    1:05 PM  Advanced Directives  Does Patient Have a Medical Advance Directive? No  Would patient like information on creating a medical advance directive? No - Patient declined   Advance Care Planning is important because  it: Ensures you receive medical care that aligns with your values, goals, and preferences. Provides guidance to your family and loved ones, reducing the emotional burden of decision-making during critical moments.  Vision: Annual vision screenings are recommended for early detection of glaucoma, cataracts, and diabetic retinopathy. These exams can also reveal signs of chronic conditions such as diabetes and high blood pressure.  Dental: Annual dental screenings help detect early signs of oral cancer, gum disease, and other conditions linked to overall health, including heart disease and diabetes.

## 2024-05-05 ENCOUNTER — Ambulatory Visit

## 2024-05-05 DIAGNOSIS — Z23 Encounter for immunization: Secondary | ICD-10-CM

## 2024-05-06 DIAGNOSIS — H43813 Vitreous degeneration, bilateral: Secondary | ICD-10-CM | POA: Diagnosis not present

## 2024-05-06 DIAGNOSIS — Z961 Presence of intraocular lens: Secondary | ICD-10-CM | POA: Diagnosis not present

## 2024-05-16 ENCOUNTER — Telehealth: Payer: Self-pay | Admitting: Family Medicine

## 2024-05-16 DIAGNOSIS — M810 Age-related osteoporosis without current pathological fracture: Secondary | ICD-10-CM

## 2024-05-16 DIAGNOSIS — I10 Essential (primary) hypertension: Secondary | ICD-10-CM

## 2024-05-16 NOTE — Telephone Encounter (Signed)
-----   Message from Veva JINNY Ferrari sent at 05/11/2024 10:22 AM EST ----- Regarding: Lab orders for Wed, 11.12.25 Patient is scheduled for CPX labs, please order future labs, Thanks , Veva

## 2024-05-19 ENCOUNTER — Ambulatory Visit: Payer: Self-pay | Admitting: Family Medicine

## 2024-05-19 ENCOUNTER — Other Ambulatory Visit (INDEPENDENT_AMBULATORY_CARE_PROVIDER_SITE_OTHER)

## 2024-05-19 DIAGNOSIS — I10 Essential (primary) hypertension: Secondary | ICD-10-CM | POA: Diagnosis not present

## 2024-05-19 DIAGNOSIS — M810 Age-related osteoporosis without current pathological fracture: Secondary | ICD-10-CM | POA: Diagnosis not present

## 2024-05-19 LAB — CBC WITH DIFFERENTIAL/PLATELET
Basophils Absolute: 0.1 K/uL (ref 0.0–0.1)
Basophils Relative: 1.2 % (ref 0.0–3.0)
Eosinophils Absolute: 0.3 K/uL (ref 0.0–0.7)
Eosinophils Relative: 5.7 % — ABNORMAL HIGH (ref 0.0–5.0)
HCT: 40.6 % (ref 36.0–46.0)
Hemoglobin: 13.5 g/dL (ref 12.0–15.0)
Lymphocytes Relative: 35.3 % (ref 12.0–46.0)
Lymphs Abs: 1.6 K/uL (ref 0.7–4.0)
MCHC: 33.3 g/dL (ref 30.0–36.0)
MCV: 91.2 fl (ref 78.0–100.0)
Monocytes Absolute: 0.5 K/uL (ref 0.1–1.0)
Monocytes Relative: 10.1 % (ref 3.0–12.0)
Neutro Abs: 2.2 K/uL (ref 1.4–7.7)
Neutrophils Relative %: 47.7 % (ref 43.0–77.0)
Platelets: 213 K/uL (ref 150.0–400.0)
RBC: 4.45 Mil/uL (ref 3.87–5.11)
RDW: 13.3 % (ref 11.5–15.5)
WBC: 4.6 K/uL (ref 4.0–10.5)

## 2024-05-19 LAB — COMPREHENSIVE METABOLIC PANEL WITH GFR
ALT: 15 U/L (ref 0–35)
AST: 22 U/L (ref 0–37)
Albumin: 4.4 g/dL (ref 3.5–5.2)
Alkaline Phosphatase: 50 U/L (ref 39–117)
BUN: 16 mg/dL (ref 6–23)
CO2: 32 meq/L (ref 19–32)
Calcium: 9.9 mg/dL (ref 8.4–10.5)
Chloride: 100 meq/L (ref 96–112)
Creatinine, Ser: 0.87 mg/dL (ref 0.40–1.20)
GFR: 63.8 mL/min (ref >60.00)
Glucose, Bld: 87 mg/dL (ref 70–99)
Potassium: 4 meq/L (ref 3.5–5.1)
Sodium: 138 meq/L (ref 135–145)
Total Bilirubin: 1.4 mg/dL — ABNORMAL HIGH (ref 0.2–1.2)
Total Protein: 6.8 g/dL (ref 6.0–8.3)

## 2024-05-19 LAB — LIPID PANEL
Cholesterol: 203 mg/dL — ABNORMAL HIGH (ref 0–200)
HDL: 87.8 mg/dL (ref >39.00)
LDL Cholesterol: 102 mg/dL — ABNORMAL HIGH (ref 0–99)
NonHDL: 115.34
Total CHOL/HDL Ratio: 2
Triglycerides: 69 mg/dL (ref 0.0–149.0)
VLDL: 13.8 mg/dL (ref 0.0–40.0)

## 2024-05-19 LAB — VITAMIN D 25 HYDROXY (VIT D DEFICIENCY, FRACTURES): VITD: 61.06 ng/mL (ref 30.00–100.00)

## 2024-05-19 LAB — TSH: TSH: 2.14 u[IU]/mL (ref 0.35–5.50)

## 2024-05-24 ENCOUNTER — Other Ambulatory Visit: Payer: Self-pay

## 2024-05-24 MED ORDER — CLOTRIMAZOLE-BETAMETHASONE 1-0.05 % EX CREA: TOPICAL_CREAM | CUTANEOUS | 0 refills | Status: AC

## 2024-05-24 NOTE — Progress Notes (Signed)
 RF requested approved. Patient uses on nails PRN flares. aw

## 2024-05-26 ENCOUNTER — Ambulatory Visit: Admitting: Family Medicine

## 2024-05-26 ENCOUNTER — Encounter: Payer: Self-pay | Admitting: Family Medicine

## 2024-05-26 VITALS — BP 112/65 | HR 68 | Temp 98.0°F | Ht 61.75 in | Wt 121.2 lb

## 2024-05-26 DIAGNOSIS — M810 Age-related osteoporosis without current pathological fracture: Secondary | ICD-10-CM

## 2024-05-26 DIAGNOSIS — Z0000 Encounter for general adult medical examination without abnormal findings: Secondary | ICD-10-CM | POA: Diagnosis not present

## 2024-05-26 DIAGNOSIS — Z85828 Personal history of other malignant neoplasm of skin: Secondary | ICD-10-CM | POA: Diagnosis not present

## 2024-05-26 DIAGNOSIS — Z1211 Encounter for screening for malignant neoplasm of colon: Secondary | ICD-10-CM

## 2024-05-26 DIAGNOSIS — Z1231 Encounter for screening mammogram for malignant neoplasm of breast: Secondary | ICD-10-CM

## 2024-05-26 DIAGNOSIS — H9193 Unspecified hearing loss, bilateral: Secondary | ICD-10-CM

## 2024-05-26 DIAGNOSIS — I10 Essential (primary) hypertension: Secondary | ICD-10-CM | POA: Diagnosis not present

## 2024-05-26 DIAGNOSIS — K589 Irritable bowel syndrome without diarrhea: Secondary | ICD-10-CM

## 2024-05-26 NOTE — Assessment & Plan Note (Signed)
 Dexa ordered for January No falls or fractures D level normal   Discussed fall prevention, supplements and exercise for bone density

## 2024-05-26 NOTE — Assessment & Plan Note (Signed)
 Had testing Thinking about hearing aides Discussed risks of hearing loss  Encouraged to try them

## 2024-05-26 NOTE — Assessment & Plan Note (Signed)
 Reviewed health habits including diet and exercise and skin cancer prevention Reviewed appropriate screening tests for age  Also reviewed health mt list, fam hx and immunization status , as well as social and family history   See HPI Labs reviewed and ordered Health Maintenance  Topic Date Due   DTaP/Tdap/Td vaccine (3 - Tdap) 06/22/2022   Breast Cancer Screening  07/31/2024   COVID-19 Vaccine (7 - Pfizer risk 2025-26 season) 10/06/2024   Medicare Annual Wellness Visit  04/30/2025   Pneumococcal Vaccine for age over 36  Completed   Flu Shot  Completed   DEXA scan (bone density measurement)  Completed   Hepatitis C Screening  Completed   Zoster (Shingles) Vaccine  Completed   Meningitis B Vaccine  Aged Out   Colon Cancer Screening  Discontinued   Plans to get Td at pharmacy  Mammo and dexa ordered for jan  Discussed fall prevention, supplements and exercise for bone density  No falls or fractures PHQ 0

## 2024-05-26 NOTE — Assessment & Plan Note (Signed)
 No clinical changes Avoids triggers Levsin  sl prn

## 2024-05-26 NOTE — Patient Instructions (Addendum)
 You are due for a tetanus shot  You can get it at the pharmacy   Keep walking  Add some strength training to your routine, this is important for bone and brain health and can reduce your risk of falls and help your body use insulin properly and regulate weight  Light weights, exercise bands , and internet videos are a good way to start  Yoga (chair or regular), machines , floor exercises or a gym with machines are also good options   Continue to consider hearing aides    You have an order for:  [x]   3D Mammogram  [x]   Bone Density     Please call for appointment:   [x]   [facility]  [address] [place] [address]  [phone]  []   [facility])   [address]  [place], [place] [address]  [phone]  []   [facility]      [address] [place], [place]        [phone]         []   [facility]  [address] [place], [place]  [phone]  []  [facility]   [address]   [place], [place] [address]  [phone]  []  [facility]  [address] [place], [place] [address] [phone]    Make sure to wear two piece clothing  No lotions powders or deodorants the day of the appointment Make sure to bring picture ID and insurance card.  Bring list of medications you are currently taking including any supplements.   Schedule your screening mammogram through MyChart!   Select [place] imaging sites can now be scheduled through MyChart.  Log into your MyChart account.  Go to 'Visit' (or 'Appointments' if  on mobile App) --> Schedule an  Appointment  Under 'Select a Reason for Visit' choose the Mammogram  Screening option.  Complete the pre-visit questions  and select the time and place that  best  fits your schedule

## 2024-05-26 NOTE — Assessment & Plan Note (Signed)
.   Lab Results  Component Value Date   ALT 15 05/19/2024   AST 22 05/19/2024   ALKPHOS 50 05/19/2024   BILITOT 1.4 (H) 05/19/2024   Fairly stable  Suspect Gilbert's  No clinical changes

## 2024-05-26 NOTE — Assessment & Plan Note (Signed)
 Had colonoscopy 2021

## 2024-05-26 NOTE — Assessment & Plan Note (Signed)
 Mammo ordered Pt will call to schedule for January

## 2024-05-26 NOTE — Progress Notes (Signed)
 Subjective:    Patient ID: Marie Kennedy, female    DOB: [birth year], 78 y.o.   MRN: [id number]  HPI  Here for health maintenance exam and to review chronic medical problems   Wt Readings from Last 3 Encounters:  05/26/24 121 lb 4 oz (55 kg)  04/30/24 125 lb (56.7 kg)  11/17/23 125 lb (56.7 kg)   22.36 kg/m  Vitals:   05/26/24 0930 05/26/24 0948  BP: 122/70 112/65  Pulse: 68   Temp: 98 F (36.7 C)   SpO2: 99%     Immunization History  Administered Date(s) Administered   Fluad Quad(high Dose 65+) 03/09/2019, 04/22/2020, 04/16/2022   Fluad Trivalent(High Dose 65+) 04/29/2023   INFLUENZA, HIGH DOSE SEASONAL PF 04/09/2021, 05/05/2024   Influenza Whole 04/30/2006   Influenza,inj,Quad PF,6+ Mos 04/22/2014, 05/23/2015, 04/15/2016, 05/01/2017, 04/02/2018   PFIZER(Purple Top)SARS-COV-2 Vaccination 08/13/2019, 09/03/2019   Pfizer Covid-19 Vaccine Bivalent Booster 13yrs & up 03/16/2021, 10/29/2021   Pfizer(Comirnaty)Fall Seasonal Vaccine 12 years and older 05/01/2022, 04/07/2024   Pneumococcal Conjugate-13 11/16/2014   Pneumococcal Polysaccharide-23 12/12/2010   Respiratory Syncytial Virus Vaccine,Recomb Aduvanted(Arexvy) 05/15/2022   Td 07/20/2002, 06/22/2012   Zoster Recombinant(Shingrix) 11/29/2019, 02/04/2020   Zoster, Live 07/03/2012    Health Maintenance Due  Topic Date Due   DTaP/Tdap/Td (3 - Tdap) 06/22/2022    Tetanus shot - will get at pharmacy   Mammogram 07/2023  Self breast exam-no lumps   Gyn health Hysterectomy    Colon cancer screening  Colonoscopy 10/2019  No bowel changes   Bone health  Dexa  07/2022  osteoporosis  Falls- none  Fractures-none   Supplements- ca and D   Last vitamin D  Lab Results  Component Value Date   VD25OH 61.06 05/19/2024    Exercise  Walks every am for an hour    Dermatology care  Goes regularly  Nothing new  Uses sun protection    Mood    05/26/2024    9:36 AM 04/30/2024    1:10 PM 11/17/2023    3:16 PM  03/17/2023    2:35 PM 05/07/2022   12:10 PM  Depression screen PHQ 2/9  Decreased Interest 0 0 0 0 0  Down, Depressed, Hopeless 0 0 0 0 0  PHQ - 2 Score 0 0 0 0 0  Altered sleeping 0 0   0  Tired, decreased energy 0 0   0  Change in appetite 0 0   0  Feeling bad or failure about yourself  0 0     Trouble concentrating 0 0   0  Moving slowly or fidgety/restless 0 0   0  Suicidal thoughts 0 0   0  PHQ-9 Score 0 0    0   Difficult doing work/chores Not difficult at all Not difficult at all   Not difficult at all     Data saved with a previous flowsheet row definition   HTN bp is stable today  No cp or palpitations or headaches or edema  No side effects to medicines  BP Readings from Last 3 Encounters:  05/26/24 112/65  11/17/23 138/70  05/23/23 118/74    Lisinopril  hct 10-12.5 mg daily  Lab Results  Component Value Date   NA 138 05/19/2024   K 4.0 05/19/2024   CO2 32 05/19/2024   GLUCOSE 87 05/19/2024   BUN 16 05/19/2024   CREATININE 0.87 05/19/2024   CALCIUM 9.9 05/19/2024   GFR 63.80 05/19/2024   GFRNONAA 86 (L) 06/14/2011  Lab Results  Component Value Date   ALT 15 05/19/2024   AST 22 05/19/2024   ALKPHOS 50 05/19/2024   BILITOT 1.4 (H) 05/19/2024   Lab Results  Component Value Date   WBC 4.6 05/19/2024   HGB 13.5 05/19/2024   HCT 40.6 05/19/2024   MCV 91.2 05/19/2024   PLT 213.0 05/19/2024   Lab Results  Component Value Date   TSH 2.14 05/19/2024   Cholesterol Lab Results  Component Value Date   CHOL 203 (H) 05/19/2024   CHOL 204 (H) 05/16/2023   CHOL 195 05/13/2022   Lab Results  Component Value Date   HDL 87.80 05/19/2024   HDL 89.50 05/16/2023   HDL 90.20 05/13/2022   Lab Results  Component Value Date   LDLCALC 102 (H) 05/19/2024   LDLCALC 98 05/16/2023   LDLCALC 90 05/13/2022   Lab Results  Component Value Date   TRIG 69.0 05/19/2024   TRIG 82.0 05/16/2023   TRIG 72.0 05/13/2022   Lab Results  Component Value Date   CHOLHDL 2  05/19/2024   CHOLHDL 2 05/16/2023   CHOLHDL 2 05/13/2022   No results found for: LDLDIRECT     Patient Active Problem List   Diagnosis Date Noted   Hearing loss 05/23/2023   Greater trochanteric pain syndrome of left lower extremity 05/01/2021   Routine general medical examination at a health care facility 11/22/2015   Personal history of other malignant neoplasm of skin 06/01/2013   Colon cancer screening 06/22/2012   Hyperbilirubinemia 06/22/2012   Osteoporosis 12/12/2010   Screening mammogram, encounter for 12/04/2010   ALLERGIC RHINITIS, SEASONAL 11/13/2009   Essential hypertension 05/04/2007   IBS 05/04/2007   ROSACEA 05/04/2007   Past Medical History:  Diagnosis Date   Allergy    Basal cell adenoma 11/14   face   Basal cell carcinoma 03/11/2013   Right forehead. Nodular pattern. Excised: 05/14/2016   Cancer (HCC)    skin   Fibroids 1993   cysts endometriosis   GERD (gastroesophageal reflux disease)    History of basal cell carcinoma (BCC) 05/14/2016   right superior forehead, excision   Hx of basal cell carcinoma 03/11/2013   Right ala of nose. BCC with sclerosis   Hx of basal cell carcinoma 01/05/2015   Left lateral proximal elbow. Superficial.   Hypertension    PCP   Dr Randeen at [facility]   IBS (irritable bowel syndrome)    Rosacea    Past Surgical History:  Procedure Laterality Date   ABDOMINAL HYSTERECTOMY  1993   BASAL CELL CARCINOMA EXCISION  11/14   face   BREAST EXCISIONAL BIOPSY Right 1970   Negative   BREAST SURGERY Right 1970's   lumpectomy no cancer   CATARACT EXTRACTION W/PHACO Left 05/19/2017   Procedure: CATARACT EXTRACTION PHACO AND INTRAOCULAR LENS PLACEMENT (IOC) LEFT;  Surgeon: Mittie Gaskin, MD;  Location: [facility];  Service: Ophthalmology;  Laterality: Left;   CATARACT EXTRACTION W/PHACO Right 06/18/2017   Procedure: CATARACT EXTRACTION PHACO AND INTRAOCULAR LENS PLACEMENT (IOC)  COMPLICATED  RIGHT;   Surgeon: Mittie Gaskin, MD;  Location: [facility];  Service: Ophthalmology;  Laterality: Right;   CHOLECYSTECTOMY  06/18/2011   Procedure: LAPAROSCOPIC CHOLECYSTECTOMY WITH INTRAOPERATIVE CHOLANGIOGRAM;  Surgeon: Debby LABOR. Cornett, MD;  Location: [facility];  Service: General;  Laterality: N/A;  lap chole with IOC   OOPHORECTOMY     TONSILLECTOMY  1953   Social History   Tobacco Use   Smoking status:  Never   Smokeless tobacco: Never  Vaping Use   Vaping status: Never Used  Substance Use Topics   Alcohol use: No    Alcohol/week: 0.0 standard drinks of alcohol   Drug use: No   Family History  Problem Relation Age of Onset   Hypertension Mother    Cancer Mother        breast, uterine, and cervical   Breast cancer Mother 67   Heart disease Father        CAD   Hypertension Father    Diabetes Father        TYPE II   Cancer Father        brain   Colon cancer Neg Hx    Esophageal cancer Neg Hx    Rectal cancer Neg Hx    Stomach cancer Neg Hx    No Known Allergies Current Outpatient Medications on File Prior to Visit  Medication Sig Dispense Refill   Calcium Carbonate-Vit D-Min 600-400 MG-UNIT TABS Take 1 tablet by mouth daily.     clotrimazole -betamethasone  (LOTRISONE ) cream APPLY TWICE A DAY TO AFFECTED FINGERS AS NEEDED 15 g 0   DiphenhydrAMINE HCl (ALLERGY MED PO) Take 25 mg by mouth daily as needed. Allergies      hyoscyamine  (LEVSIN  SL) 0.125 MG SL tablet PLACE 1 TABLET UNDER THE TONGUE 2 TIMES DAILY AS NEEDED FOR ABDOMINAL CRAMPING 180 tablet 1   lisinopril -hydrochlorothiazide  (ZESTORETIC ) 10-12.5 MG tablet Take 0.5 tablets by mouth daily. 45 tablet 3   metroNIDAZOLE  (METROCREAM ) 0.75 % cream At bedtime to face for Rosacea 135 g 3   metroNIDAZOLE  (METROGEL ) 0.75 % gel Apply 1 Application topically daily. Qam to face for Rosacea 135 g 3   No current facility-administered medications on file prior to visit.    Review of Systems  Constitutional:  Negative  for activity change, appetite change, fatigue, fever and unexpected weight change.  HENT:  Negative for congestion, ear pain, rhinorrhea, sinus pressure and sore throat.   Eyes:  Negative for pain, redness and visual disturbance.  Respiratory:  Negative for cough, shortness of breath and wheezing.   Cardiovascular:  Negative for chest pain and palpitations.  Gastrointestinal:  Negative for abdominal pain, blood in stool, constipation and diarrhea.  Endocrine: Negative for polydipsia and polyuria.  Genitourinary:  Negative for dysuria, frequency and urgency.  Musculoskeletal:  Positive for arthralgias. Negative for back pain and myalgias.  Skin:  Negative for pallor and rash.  Allergic/Immunologic: Negative for environmental allergies.  Neurological:  Negative for dizziness, syncope and headaches.  Hematological:  Negative for adenopathy. Does not bruise/bleed easily.  Psychiatric/Behavioral:  Negative for decreased concentration and dysphoric mood. The patient is not nervous/anxious.        Objective:   Physical Exam Constitutional:      General: She is not in acute distress.    Appearance: Normal appearance. She is well-developed and normal weight. She is not ill-appearing or diaphoretic.  HENT:     Head: Normocephalic and atraumatic.     Right Ear: Tympanic membrane, ear canal and external ear normal.     Left Ear: Tympanic membrane, ear canal and external ear normal.     Nose: Nose normal. No congestion.     Mouth/Throat:     Mouth: Mucous membranes are moist.     Pharynx: Oropharynx is clear. No posterior oropharyngeal erythema.  Eyes:     General: No scleral icterus.    Extraocular Movements: Extraocular movements intact.     Conjunctiva/sclera:  Conjunctivae normal.     Pupils: Pupils are equal, round, and reactive to light.  Neck:     Thyroid : No thyromegaly.     Vascular: No carotid bruit or JVD.  Cardiovascular:     Rate and Rhythm: Normal rate and regular rhythm.      Pulses: Normal pulses.     Heart sounds: Normal heart sounds.     No gallop.  Pulmonary:     Effort: Pulmonary effort is normal. No respiratory distress.     Breath sounds: Normal breath sounds. No wheezing.     Comments: Good air exch Chest:     Chest wall: No tenderness.  Abdominal:     General: Bowel sounds are normal. There is no distension or abdominal bruit.     Palpations: Abdomen is soft. There is no mass.     Tenderness: There is no abdominal tenderness.     Hernia: No hernia is present.  Genitourinary:    Comments: Breast exam: No mass, nodules, thickening, tenderness, bulging, retraction, inflamation, nipple discharge or skin changes noted.  No axillary or clavicular LA.     Musculoskeletal:        General: No tenderness. Normal range of motion.     Cervical back: Normal range of motion and neck supple. No rigidity. No muscular tenderness.     Right lower leg: No edema.     Left lower leg: No edema.     Comments: No kyphosis   Lymphadenopathy:     Cervical: No cervical adenopathy.  Skin:    General: Skin is warm and dry.     Coloration: Skin is not pale.     Findings: No erythema or rash.     Comments: Solar lentigines diffusely  Scattered sks   Neurological:     Mental Status: She is alert. Mental status is at baseline.     Cranial Nerves: No cranial nerve deficit.     Motor: No abnormal muscle tone.     Coordination: Coordination normal.     Gait: Gait normal.     Deep Tendon Reflexes: Reflexes are normal and symmetric. Reflexes normal.  Psychiatric:        Mood and Affect: Mood normal.        Cognition and Memory: Cognition and memory normal.           Assessment & Plan:   Problem List Items Addressed This Visit       Cardiovascular and Mediastinum   Essential hypertension   bp in fair control at this time  BP Readings from Last 1 Encounters:  05/26/24 112/65   No changes needed Most recent labs reviewed  Disc lifstyle change with low sodium  diet and exercise  Plan to continue lisinopril  hct 10-12.5 mg daily         Digestive   IBS   No clinical changes Avoids triggers Levsin  sl prn         Nervous and Auditory   Hearing loss   Had testing Thinking about hearing aides Discussed risks of hearing loss  Encouraged to try them        Musculoskeletal and Integument   Osteoporosis   Dexa ordered for January No falls or fractures D level normal   Discussed fall prevention, supplements and exercise for bone density        Relevant Orders   DG Bone Density     Other   Screening mammogram, encounter for   Mammo ordered Pt will  call to schedule for January       Relevant Orders   MM 3D SCREENING MAMMOGRAM BILATERAL BREAST   Routine general medical examination at a health care facility - Primary   Reviewed health habits including diet and exercise and skin cancer prevention Reviewed appropriate screening tests for age  Also reviewed health mt list, fam hx and immunization status , as well as social and family history   See HPI Labs reviewed and ordered Health Maintenance  Topic Date Due   DTaP/Tdap/Td vaccine (3 - Tdap) 06/22/2022   Breast Cancer Screening  07/31/2024   COVID-19 Vaccine (7 - Pfizer risk 2025-26 season) 10/06/2024   Medicare Annual Wellness Visit  04/30/2025   Pneumococcal Vaccine for age over 38  Completed   Flu Shot  Completed   DEXA scan (bone density measurement)  Completed   Hepatitis C Screening  Completed   Zoster (Shingles) Vaccine  Completed   Meningitis B Vaccine  Aged Out   Colon Cancer Screening  Discontinued   Plans to get Td at pharmacy  Mammo and dexa ordered for jan  Discussed fall prevention, supplements and exercise for bone density  No falls or fractures PHQ 0        Personal history of other malignant neoplasm of skin   Utd derm care Nothing new Uses sun protection       Hyperbilirubinemia   . Lab Results  Component Value Date   ALT 15 05/19/2024    AST 22 05/19/2024   ALKPHOS 50 05/19/2024   BILITOT 1.4 (H) 05/19/2024   Fairly stable  Suspect Gilbert's  No clinical changes       Colon cancer screening   Had colonoscopy 2021

## 2024-05-26 NOTE — Assessment & Plan Note (Signed)
 bp in fair control at this time  BP Readings from Last 1 Encounters:  05/26/24 112/65   No changes needed Most recent labs reviewed  Disc lifstyle change with low sodium diet and exercise  Plan to continue lisinopril  hct 10-12.5 mg daily

## 2024-05-26 NOTE — Assessment & Plan Note (Signed)
 Utd derm care Nothing new Uses sun protection

## 2024-06-01 ENCOUNTER — Other Ambulatory Visit: Payer: Self-pay | Admitting: Family Medicine

## 2024-06-11 ENCOUNTER — Telehealth: Payer: Self-pay

## 2024-06-11 NOTE — Telephone Encounter (Signed)
 MyChart message sent to patient regarding [facility] and if she has completed the visit or if she is scheduled.

## 2024-08-02 ENCOUNTER — Other Ambulatory Visit

## 2024-08-02 ENCOUNTER — Encounter

## 2024-08-25 ENCOUNTER — Other Ambulatory Visit

## 2024-08-25 ENCOUNTER — Encounter

## 2025-04-12 ENCOUNTER — Ambulatory Visit: Admitting: Dermatology

## 2025-05-30 ENCOUNTER — Ambulatory Visit

## 2025-05-31 ENCOUNTER — Ambulatory Visit

## 2025-05-31 ENCOUNTER — Encounter: Admitting: Family Medicine
